# Patient Record
Sex: Female | Born: 1970 | Race: White | Hispanic: No | Marital: Married | Smoking: Never smoker
Health system: Southern US, Community
[De-identification: ages and names within clinical notes are randomized; demographics above are authoritative.]

## PROBLEM LIST (undated history)

## (undated) DIAGNOSIS — G4730 Sleep apnea, unspecified: Secondary | ICD-10-CM

## (undated) DIAGNOSIS — C50212 Malignant neoplasm of upper-inner quadrant of left female breast: Secondary | ICD-10-CM

## (undated) DIAGNOSIS — I10 Essential (primary) hypertension: Secondary | ICD-10-CM

## (undated) DIAGNOSIS — Z803 Family history of malignant neoplasm of breast: Secondary | ICD-10-CM

## (undated) DIAGNOSIS — Z87442 Personal history of urinary calculi: Secondary | ICD-10-CM

## (undated) DIAGNOSIS — Z923 Personal history of irradiation: Secondary | ICD-10-CM

## (undated) DIAGNOSIS — J45909 Unspecified asthma, uncomplicated: Secondary | ICD-10-CM

## (undated) HISTORY — PX: DILATION AND CURETTAGE OF UTERUS: SHX78

## (undated) HISTORY — DX: Malignant neoplasm of upper-inner quadrant of left female breast: C50.212

## (undated) HISTORY — PX: TONSILLECTOMY: SUR1361

## (undated) HISTORY — DX: Family history of malignant neoplasm of breast: Z80.3

---

## 1988-09-22 DIAGNOSIS — C801 Malignant (primary) neoplasm, unspecified: Secondary | ICD-10-CM

## 1988-09-22 HISTORY — DX: Malignant (primary) neoplasm, unspecified: C80.1

## 2003-09-23 HISTORY — PX: BREAST EXCISIONAL BIOPSY: SUR124

## 2006-09-22 HISTORY — PX: BACK SURGERY: SHX140

## 2021-07-30 ENCOUNTER — Other Ambulatory Visit: Payer: Self-pay

## 2021-07-30 ENCOUNTER — Ambulatory Visit
Admission: RE | Admit: 2021-07-30 | Discharge: 2021-07-30 | Disposition: A | Discharge status: Home / Self Care | Payer: Commercial Managed Care - PPO | Source: Ambulatory Visit | Attending: Internal Medicine | Admitting: Internal Medicine

## 2021-07-30 ENCOUNTER — Other Ambulatory Visit: Payer: Self-pay | Admitting: Internal Medicine

## 2021-07-30 DIAGNOSIS — Z1231 Encounter for screening mammogram for malignant neoplasm of breast: Secondary | ICD-10-CM

## 2021-08-02 ENCOUNTER — Other Ambulatory Visit: Payer: Self-pay | Admitting: Internal Medicine

## 2021-08-02 DIAGNOSIS — R928 Other abnormal and inconclusive findings on diagnostic imaging of breast: Secondary | ICD-10-CM

## 2021-08-30 ENCOUNTER — Other Ambulatory Visit: Payer: Self-pay | Admitting: Internal Medicine

## 2021-08-30 ENCOUNTER — Ambulatory Visit
Admission: RE | Admit: 2021-08-30 | Discharge: 2021-08-30 | Disposition: A | Discharge status: Home / Self Care | Payer: Commercial Managed Care - PPO | Source: Ambulatory Visit | Attending: Internal Medicine | Admitting: Internal Medicine

## 2021-08-30 DIAGNOSIS — R928 Other abnormal and inconclusive findings on diagnostic imaging of breast: Secondary | ICD-10-CM

## 2021-08-30 DIAGNOSIS — N6320 Unspecified lump in the left breast, unspecified quadrant: Secondary | ICD-10-CM

## 2021-09-10 ENCOUNTER — Ambulatory Visit
Admission: RE | Admit: 2021-09-10 | Discharge: 2021-09-10 | Disposition: A | Discharge status: Home / Self Care | Payer: Commercial Managed Care - PPO | Source: Ambulatory Visit | Attending: Internal Medicine | Admitting: Internal Medicine

## 2021-09-10 DIAGNOSIS — N6320 Unspecified lump in the left breast, unspecified quadrant: Secondary | ICD-10-CM

## 2021-09-24 ENCOUNTER — Other Ambulatory Visit: Payer: Self-pay | Admitting: General Surgery

## 2021-09-24 DIAGNOSIS — Z170 Estrogen receptor positive status [ER+]: Secondary | ICD-10-CM

## 2021-09-24 DIAGNOSIS — C50212 Malignant neoplasm of upper-inner quadrant of left female breast: Secondary | ICD-10-CM

## 2021-09-25 ENCOUNTER — Other Ambulatory Visit: Payer: Self-pay | Admitting: General Surgery

## 2021-09-25 ENCOUNTER — Telehealth: Payer: Self-pay | Admitting: Genetic Counselor

## 2021-09-25 DIAGNOSIS — Z170 Estrogen receptor positive status [ER+]: Secondary | ICD-10-CM

## 2021-09-25 DIAGNOSIS — C50212 Malignant neoplasm of upper-inner quadrant of left female breast: Secondary | ICD-10-CM

## 2021-09-25 NOTE — Telephone Encounter (Signed)
Scheduled appt per 1/4 referral. Pt is aware of appt date and time.

## 2021-09-26 ENCOUNTER — Ambulatory Visit (INDEPENDENT_AMBULATORY_CARE_PROVIDER_SITE_OTHER): Payer: Commercial Managed Care - PPO | Admitting: Plastic Surgery

## 2021-09-26 ENCOUNTER — Other Ambulatory Visit: Payer: Self-pay

## 2021-09-26 VITALS — BP 120/66 | HR 85 | Ht 65.0 in | Wt 287.8 lb

## 2021-09-26 DIAGNOSIS — C50912 Malignant neoplasm of unspecified site of left female breast: Secondary | ICD-10-CM

## 2021-09-26 NOTE — Progress Notes (Signed)
Referring Provider Stark Klein, MD [place],   [id number]   CC:  Chief Complaint  Patient presents with   Advice Only      Destiny Griffin is an 51 y.o. female.  HPI: Patient presents to discuss breast reconstruction.  She has a newly diagnosed left-sided breast cancer.  She has seen Dr. Barry Dienes and is still considering lumpectomy versus mastectomy.  She does have a mother who had a unilateral cancer which was treated with unilateral mastectomy and subsequently had a new primary on the contralateral side that had to be treated.  She is concerned about this outcome for herself.  She does not smoke and is not a diabetic.  She had genetic screening sent off and will likely use that to determine which cancer surgical procedure she prefers.  Allergies  Allergen Reactions   Penicillins Rash    Outpatient Encounter Medications as of 09/26/2021  Medication Sig   lisinopril-hydrochlorothiazide (ZESTORETIC) 10-12.5 MG tablet Take 1 tablet by mouth daily.   pantoprazole (PROTONIX) 40 MG tablet Take by mouth.   No facility-administered encounter medications on file as of 09/26/2021.     No past medical history on file.  Past Surgical History:  Procedure Laterality Date   BREAST EXCISIONAL BIOPSY Left 2005   removed lump    Family History  Problem Relation Age of Onset   Breast cancer Mother     Social History   Social History Narrative   Not on file     Review of Systems General: Denies fevers, chills, weight loss CV: Denies chest pain, shortness of breath, palpitations  Physical Exam Vitals with BMI 09/26/2021  Height 5\' 5"   Weight 287 lbs 13 oz  BMI 01.09  Systolic 323  Diastolic 66  Pulse 85    General:  No acute distress,  Alert and oriented, Non-Toxic, Normal speech and affect Breast: She has grade 3 ptosis.  She does have a left-sided upper outer quadrant scar from a benign cyst removal done in the past.  Sternal notch to nipple  distance is 31 cm bilaterally and base width is 13.5 cm.  Assessment/Plan I long discussion with the patient about her options.  I did bring up the possibility if she has a lumpectomy of a oncoplastic reduction.  This could either be done a week or 2 after her lumpectomy or well down the line after radiation if she chooses.  The mass is 1 cm in size and she may not have much distortion from the lumpectomy itself but she does report the desire to want to be smaller and lifted and this would certainly accomplish that.  Alternatively if she chose bilateral mastectomy we did discuss immediate placement of a tissue expander followed by expansion and replacement with a gel implant at a second stage.  This was allow her to participate in the decision making regarding the size which I think would be helpful for her.  We discussed the details of that process along with the risks of each procedure that include bleeding, infection, damage to surrounding structures need for additional procedures.  Discussed that wound healing complications could ultimately result in loss of the implant or expander.  We discussed the need for drains with expander placement.  All of her questions were answered regarding both approaches and we will wait to see what the results of her genetic analysis are and how she wants to proceed from a cancer standpoint.  Cindra Presume 09/26/2021,  4:31 PM

## 2021-09-30 ENCOUNTER — Other Ambulatory Visit: Payer: Self-pay | Admitting: *Deleted

## 2021-09-30 ENCOUNTER — Telehealth: Payer: Self-pay | Admitting: Hematology and Oncology

## 2021-09-30 DIAGNOSIS — C50912 Malignant neoplasm of unspecified site of left female breast: Secondary | ICD-10-CM

## 2021-09-30 DIAGNOSIS — Z170 Estrogen receptor positive status [ER+]: Secondary | ICD-10-CM

## 2021-09-30 NOTE — Telephone Encounter (Signed)
Scheduled appt per 1/9 referral. Called pt, no answer. Left msg for pt to call back to confirm appt date and time.

## 2021-10-01 NOTE — Progress Notes (Signed)
Surgical Instructions   Your procedure is scheduled on Wednesday, 10/09/21.  Report to [facility] Main Entrance "A" at 11:00 A.M., then check in with the Admitting office.  Call [phone] if you have problems or questions between now and the morning of surgery:   Remember: Do not eat after midnight the night before your surgery  You may drink clear liquids until 10:00 a.m. the morning of your surgery.   Clear liquids allowed are: Water, Non-Citrus Juices (without pulp), Carbonated Beverages, Clear Tea, Black Coffee Only (NO MILK, CREAM, or POWDERED CREAMER of any kind), and Gatorade   Take these medicines the morning of surgery with A SIP OF WATER:  Pantoprazole (Protonix)   If needed you may take these medications the morning of surgery: Albuterol (Ventolin) inhaler   As of today, STOP taking any Aspirin (unless otherwise instructed by your surgeon) or Aspirin-containing products; NSAIDS - Aleve, Naproxen, Ibuprofen, Motrin, Advil, Goody's, BC's, all herbal medications, fish oil, and all vitamins.   3 days leading up to your surgery  You are not required to quarantine however you are required to wear a well-fitting mask when you are out and around people not in your household.  If your mask becomes wet or soiled, replace with a new one.  Wash your hands often with soap and water for 20 seconds or clean your hands with an alcohol-based hand sanitizer that contains at least 60% alcohol.  Do not share personal items.  Notify your provider: if you are in close contact with someone who has COVID  or if you develop a fever of 100.4 or greater, sneezing, cough, sore throat, shortness of breath or body aches.           Do not wear jewelry or makeup  Do not wear lotions, powders, perfumes/colognes, or deodorant.  Do not shave 48 hours prior to surgery.  Men may shave face and neck.  Do not wear nail polish, gel polish, artificial nails, or any other type of covering on natural  nails including fingernails and toenails. If patients have artificial nails, gel coating, etc. that need to be removed by a nail salon please have this removed prior to surgery or surgery may need to be canceled/delayed if the surgeon/ anesthesia feels like the patient is unable to be adequately monitored.  Do not bring valuables to the hospital - [facility] is not responsible for any belongings or valuables.  Do NOT Smoke (Tobacco/Vaping) or drink Alcohol 24 hours prior to your procedure  If you use a CPAP at night, please bring your mask for your overnight stay.   Contacts, glasses, hearing aids, dentures or partials may not be worn into surgery, please bring cases for these belongings   For patients admitted to the hospital, discharge time will be determined by your treatment team.   Patients discharged the day of surgery will not be allowed to drive home, and someone needs to stay with them for 24 hours.  NO VISITORS WILL BE ALLOWED IN PRE-OP WHERE PATIENTS ARE PREPPED FOR SURGERY.  ONLY 1 SUPPORT PERSON MAY BE PRESENT IN THE WAITING ROOM WHILE YOU ARE IN SURGERY.  IF YOU ARE TO BE ADMITTED, ONCE YOU ARE IN YOUR ROOM YOU WILL BE ALLOWED TWO (2) VISITORS. 1 (ONE) VISITOR MAY STAY OVERNIGHT BUT MUST ARRIVE TO THE ROOM BY 8pm.  Minor children may have two parents present. Special consideration for safety and communication needs will be reviewed on a case by case basis.  Special  instructions:    Oral Hygiene is also important to reduce your risk of infection.  Remember - BRUSH YOUR TEETH THE MORNING OF SURGERY WITH YOUR REGULAR TOOTHPASTE   [facility]- Preparing For Surgery  Before surgery, you can play an important role. Because skin is not sterile, your skin needs to be as free of germs as possible. You can reduce the number of germs on your skin by washing with CHG (chlorahexidine gluconate) Soap before surgery.  CHG is an antiseptic cleaner which kills germs and bonds with the skin to  continue killing germs even after washing.     Please do not use if you have an allergy to CHG or antibacterial soaps. If your skin becomes reddened/irritated stop using the CHG.  Do not shave (including legs and underarms) for at least 48 hours prior to first CHG shower. It is OK to shave your face.  Please follow these instructions carefully.     Shower the NIGHT BEFORE SURGERY and the MORNING OF SURGERY with CHG Soap.   If you chose to wash your hair, wash your hair first as usual with your normal shampoo. After you shampoo, rinse your hair and body thoroughly to remove the shampoo.    Then [employer] and genitals (private parts) with your normal soap and rinse thoroughly to remove soap.  Next use the CHG Soap as you would any other liquid soap. You can apply CHG directly to the skin and wash gently with a clean washcloth.   Apply the CHG Soap to your body ONLY FROM THE NECK DOWN.  Do not use on open wounds or open sores. Avoid contact with your eyes, ears, mouth and genitals (private parts). Wash Face and genitals (private parts)  with your normal soap.   Wash thoroughly, paying special attention to the area where your surgery will be performed.  Thoroughly rinse your body with warm water from the neck down.  DO NOT shower/wash with your normal soap after using and rinsing off the CHG Soap.  Pat yourself dry with a CLEAN TOWEL.  Wear CLEAN PAJAMAS to bed the night before surgery  Place CLEAN SHEETS on your bed the night before your surgery  DO NOT SLEEP WITH PETS.   Day of Surgery:  Take a shower with CHG soap. Wear Clean/Comfortable clothing the morning of surgery Do not apply any deodorants/lotions.   Remember to brush your teeth WITH YOUR REGULAR TOOTHPASTE.   Please read over the fact sheets that you were given.

## 2021-10-02 ENCOUNTER — Inpatient Hospital Stay: Payer: Commercial Managed Care - PPO

## 2021-10-02 ENCOUNTER — Encounter (HOSPITAL_COMMUNITY)
Admission: RE | Admit: 2021-10-02 | Discharge: 2021-10-02 | Disposition: A | Discharge status: Home / Self Care | Payer: Commercial Managed Care - PPO | Source: Ambulatory Visit | Attending: General Surgery | Admitting: General Surgery

## 2021-10-02 ENCOUNTER — Inpatient Hospital Stay: Payer: Commercial Managed Care - PPO | Attending: Genetic Counselor | Admitting: Genetic Counselor

## 2021-10-02 ENCOUNTER — Encounter (HOSPITAL_COMMUNITY): Payer: Self-pay

## 2021-10-02 ENCOUNTER — Other Ambulatory Visit: Payer: Self-pay

## 2021-10-02 ENCOUNTER — Other Ambulatory Visit: Payer: Self-pay | Admitting: Genetic Counselor

## 2021-10-02 VITALS — BP 134/72 | HR 90 | Temp 98.3°F | Resp 17 | Ht 65.0 in | Wt 294.7 lb

## 2021-10-02 DIAGNOSIS — C50912 Malignant neoplasm of unspecified site of left female breast: Secondary | ICD-10-CM

## 2021-10-02 DIAGNOSIS — Z170 Estrogen receptor positive status [ER+]: Secondary | ICD-10-CM

## 2021-10-02 DIAGNOSIS — C50212 Malignant neoplasm of upper-inner quadrant of left female breast: Secondary | ICD-10-CM | POA: Diagnosis not present

## 2021-10-02 DIAGNOSIS — I10 Essential (primary) hypertension: Secondary | ICD-10-CM | POA: Diagnosis not present

## 2021-10-02 DIAGNOSIS — Z01818 Encounter for other preprocedural examination: Secondary | ICD-10-CM | POA: Diagnosis not present

## 2021-10-02 DIAGNOSIS — Z803 Family history of malignant neoplasm of breast: Secondary | ICD-10-CM | POA: Diagnosis not present

## 2021-10-02 HISTORY — DX: Essential (primary) hypertension: I10

## 2021-10-02 HISTORY — DX: Unspecified asthma, uncomplicated: J45.909

## 2021-10-02 HISTORY — DX: Sleep apnea, unspecified: G47.30

## 2021-10-02 HISTORY — DX: Personal history of urinary calculi: Z87.442

## 2021-10-02 LAB — BASIC METABOLIC PANEL
Anion gap: 8 (ref 5–15)
BUN: 14 mg/dL (ref 6–20)
CO2: 29 mmol/L (ref 22–32)
Calcium: 8.8 mg/dL — ABNORMAL LOW (ref 8.9–10.3)
Chloride: 100 mmol/L (ref 98–111)
Creatinine, Ser: 0.78 mg/dL (ref 0.44–1.00)
GFR, Estimated: >60 mL/min (ref >60)
Glucose, Bld: 90 mg/dL (ref 70–99)
Potassium: 3.5 mmol/L (ref 3.5–5.1)
Sodium: 137 mmol/L (ref 135–145)

## 2021-10-02 LAB — GENETIC SCREENING ORDER

## 2021-10-02 LAB — CBC
HCT: 39.5 % (ref 36.0–46.0)
Hemoglobin: 12.4 g/dL (ref 12.0–15.0)
MCH: 28.1 pg (ref 26.0–34.0)
MCHC: 31.4 g/dL (ref 30.0–36.0)
MCV: 89.6 fL (ref 80.0–100.0)
Platelets: 561 10*3/uL — ABNORMAL HIGH (ref 150–400)
RBC: 4.41 MIL/uL (ref 3.87–5.11)
RDW: 14.3 % (ref 11.5–15.5)
WBC: 12.1 10*3/uL — ABNORMAL HIGH (ref 4.0–10.5)
nRBC: 0 % (ref 0.0–0.2)

## 2021-10-02 NOTE — Progress Notes (Signed)
PCP - Dr. Glenda Chroman- Ledell Noss, [place] Cardiologist - denies  PPM/ICD - denies   Chest x-ray - 10 + years ago, no abnormalities per pt EKG - 2008, NSR per pt- records requested Stress Test - denies ECHO - denies Cardiac Cath - denies  Sleep Study - pt states about 20 years ago she was diagnosed with OSA. She went to ENT, they removed her uvula and she has had no issues with sleep apnea since CPAP - no  DM- denies  Blood Thinner Instructions: n/a Aspirin Instructions: n/a  ERAS Protcol - yes PRE-SURGERY Ensure given at PAT  COVID TEST- N/A, ambulatory surgery   Anesthesia review: yes, records requested  Patient denies shortness of breath, fever, cough and chest pain at PAT appointment   All instructions explained to the patient, with a verbal understanding of the material. Patient agrees to go over the instructions while at home for a better understanding. Patient also instructed to wear a mask in public for 3 days prior to surgery. The opportunity to ask questions was provided.

## 2021-10-03 ENCOUNTER — Encounter: Payer: Self-pay | Admitting: Genetic Counselor

## 2021-10-03 DIAGNOSIS — C50212 Malignant neoplasm of upper-inner quadrant of left female breast: Secondary | ICD-10-CM | POA: Insufficient documentation

## 2021-10-03 DIAGNOSIS — Z803 Family history of malignant neoplasm of breast: Secondary | ICD-10-CM | POA: Insufficient documentation

## 2021-10-03 NOTE — Progress Notes (Signed)
REFERRING PROVIDER: Stark Klein, MD [place],  [place] [id number]  PRIMARY PROVIDER:  Pcp, No  PRIMARY REASON FOR VISIT:  1. Family history of breast cancer   2. Malignant neoplasm of upper-inner quadrant of left breast in female, estrogen receptor positive ([place])      HISTORY OF PRESENT ILLNESS:   Ms. Novitski, a 51 y.o. female, was seen for a [facility] cancer genetics consultation at the request of Dr. Barry Dienes due to a personal and family history of breast cancer.  Ms. Falter presents to clinic today to discuss the possibility of a hereditary predisposition to cancer, genetic testing, and to further clarify her future cancer risks, as well as potential cancer risks for family members.   In January 2023, at the age of 5, Ms. Cancelliere was diagnosed with cancer of the left beast. The treatment plan includes lumpectomy and radiation.      CANCER HISTORY:  Oncology History   No history exists.     RISK FACTORS:  Menarche was at age 41-11.  First live birth at age 70.  OCP use for approximately 2 years.  Ovaries intact: yes.  Hysterectomy: no.  Menopausal status: postmenopausal.  HRT use: 0 years. Colonoscopy: yes; normal. Mammogram within the last year: yes. Number of breast biopsies: 2. Up to date with pelvic exams: yes. Any excessive radiation exposure in the past: no  Past Medical History:  Diagnosis Date   Asthma    Cancer ([place]) 1990   cervical   Family history of breast cancer    History of kidney stones    Hypertension    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[place])    Sleep apnea    hx of sleep apnea around 2000, ENT removed uvula, no sleep apnea since    Past Surgical History:  Procedure Laterality Date   BACK SURGERY  2008   lumbar fusion   BREAST EXCISIONAL BIOPSY Left 2005   removed lump   DILATION AND CURETTAGE OF UTERUS     1987 and [place]     removed as a child    Social History    Socioeconomic History   Marital status: Married    Spouse name: Not on file   Number of children: Not on file   Years of education: Not on file   Highest education level: Not on file  Occupational History   Not on file  Tobacco Use   Smoking status: Never   Smokeless tobacco: Never  Vaping Use   Vaping Use: Never used  Substance and Sexual Activity   Alcohol use: Yes    Comment: very rare (glass of wine maybe just on holidays)   Drug use: Never   Sexual activity: Yes    Birth control/protection: Surgical    Comment: husband- vasectomy  Other Topics Concern   Not on file  Social History Narrative   Not on file   Social Determinants of Health   Financial Resource Strain: Not on file  Food Insecurity: Not on file  Transportation Needs: Not on file  Physical Activity: Not on file  Stress: Not on file  Social Connections: Not on file     FAMILY HISTORY:  We obtained a detailed, 4-generation family history.  Significant diagnoses are listed below: Family History  Problem Relation Age of Onset   Breast cancer Mother 48       Bilateral   Stomach cancer Father  The patient has one son who died at age 30.  He has a son who is currently 92 and cancer free.  She has one full brother who is cancer free. She knows of two maternal half sisters and there are others as well that she is not aware of, who she believes is cancer free.  Both parents are deceased.  The patient's mother had bilateral breast cancer in her 53's.  There is no additional information about this side of the family.  The patient's father may have had stomach cancer and died.  He had siblings but there is no additional information on this side of the family.  Ms. Goughnour is unaware of previous family history of genetic testing for hereditary cancer risks. Patient's maternal ancestors are of [place] descent, and paternal ancestors are of Caucasian descent. There is no reported Ashkenazi Jewish ancestry. There is  no known consanguinity.  GENETIC COUNSELING ASSESSMENT: Ms. Karras is a 51 y.o. female with a personal and family history of breast cancer which is somewhat suggestive of a hereditary cancer syndrome and predisposition to cancer given the number of breast cancer cases in the family. We, therefore, discussed and recommended the following at today's visit.   DISCUSSION: We discussed that, in general, most cancer is not inherited in families, but instead is sporadic or familial. Sporadic cancers occur by chance and typically happen at older ages (>50 years) as this type of cancer is caused by genetic changes acquired during an individuals lifetime. Some families have more cancers than would be expected by chance; however, the ages or types of cancer are not consistent with a known genetic mutation or known genetic mutations have been ruled out. This type of familial cancer is thought to be due to a combination of multiple genetic, environmental, hormonal, and lifestyle factors. While this combination of factors likely increases the risk of cancer, the exact source of this risk is not currently identifiable or testable.  We discussed that 5 - 10% of breast cancer is hereditary, with most cases associated with BRCA mutations.  There are other genes that can be associated with hereditary breast cancer syndromes.  These include ATM, CHEK2 and PALB2.  We discussed that testing is beneficial for several reasons including knowing how to follow individuals after completing their treatment, identifying whether potential treatment options such as PARP inhibitors would be beneficial, and understand if other family members could be at risk for cancer and allow them to undergo genetic testing.   We reviewed the characteristics, features and inheritance patterns of hereditary cancer syndromes. We also discussed genetic testing, including the appropriate family members to test, the process of testing, insurance coverage and  turn-around-time for results. We discussed the implications of a negative, positive and/or variant of uncertain significant result. In order to get genetic test results in a timely manner so that Ms. Leugers can use these genetic test results for surgical decisions, we recommended Ms. Waner pursue genetic testing for the BRCA Plus. Once complete, we recommend Ms. Desa pursue reflex genetic testing to the CancerNext-Expanded+RNAinsight gene panel.   The CancerNext-Expanded gene panel offered by [facility] and includes sequencing and rearrangement analysis for the following 77 genes: AIP, ALK, APC*, ATM*, AXIN2, BAP1, BARD1, BLM, BMPR1A, BRCA1*, BRCA2*, BRIP1*, CDC73, CDH1*, CDK4, CDKN1B, CDKN2A, CHEK2*, CTNNA1, DICER1, FANCC, FH, FLCN, GALNT12, KIF1B, LZTR1, MAX, MEN1, MET, MLH1*, MSH2*, MSH3, MSH6*, MUTYH*, NBN, NF1*, NF2, NTHL1, PALB2*, PHOX2B, PMS2*, POT1, PRKAR1A, PTCH1, PTEN*, RAD51C*, RAD51D*, RB1, RECQL, RET, SDHA, SDHAF2,  SDHB, SDHC, SDHD, SMAD4, SMARCA4, SMARCB1, SMARCE1, STK11, SUFU, TMEM127, TP53*, TSC1, TSC2, VHL and XRCC2 (sequencing and deletion/duplication); EGFR, EGLN1, HOXB13, KIT, MITF, PDGFRA, POLD1, and POLE (sequencing only); EPCAM and GREM1 (deletion/duplication only). DNA and RNA analyses performed for * genes.   Based on Ms. Carstens personal and family history of cancer, she meets medical criteria for genetic testing. Despite that she meets criteria, she may still have an out of pocket cost.   PLAN: After considering the risks, benefits, and limitations, Ms. Loudin provided informed consent to pursue genetic testing and the blood sample was sent to [facility] for analysis of the CancerNext-Expanded+RNAinsight panel. Results should be available within approximately 5-10 days for the BRCA plus and a total of 2-3 weeks' time, at which point they will be disclosed by telephone to Ms. Maddux, as will any additional recommendations warranted by these results. Ms. Carpenter will receive  a summary of her genetic counseling visit and a copy of her results once available. This information will also be available in Epic.   Lastly, we encouraged Ms. Wesby to remain in contact with cancer genetics annually so that we can continuously update the family history and inform her of any changes in cancer genetics and testing that may be of benefit for this family.   Ms. Taliaferro questions were answered to her satisfaction today. Our contact information was provided should additional questions or concerns arise. Thank you for the referral and allowing [place] to share in the care of your patient.   Gerda Yin P. Florene Glen, [place], [facility] Licensed, Insurance risk surveyor [email or web address] phone: [phone]  The patient was seen for a total of 35 minutes in face-to-face genetic counseling.  The patient was seen alone.  This patient was discussed with Drs. Magrinat, Lindi Adie and/or Burr Medico who agrees with the above.    _______________________________________________________________________ For Office Staff:  Number of people involved in session: 1 Was an Intern/ student involved with case: no

## 2021-10-04 ENCOUNTER — Inpatient Hospital Stay: Payer: Commercial Managed Care - PPO | Admitting: Hematology and Oncology

## 2021-10-04 ENCOUNTER — Telehealth: Payer: Self-pay | Admitting: Plastic Surgery

## 2021-10-04 ENCOUNTER — Encounter: Payer: Self-pay | Admitting: *Deleted

## 2021-10-04 NOTE — Telephone Encounter (Signed)
LVM to follow up with patient about her decision regarding reconstruction. Advd pt to call back to notify us so we can proceed.

## 2021-10-08 ENCOUNTER — Ambulatory Visit
Admission: RE | Admit: 2021-10-08 | Discharge: 2021-10-08 | Disposition: A | Discharge status: Home / Self Care | Payer: Commercial Managed Care - PPO | Source: Ambulatory Visit | Attending: General Surgery | Admitting: General Surgery

## 2021-10-08 ENCOUNTER — Other Ambulatory Visit: Payer: Self-pay | Admitting: General Surgery

## 2021-10-08 DIAGNOSIS — Z170 Estrogen receptor positive status [ER+]: Secondary | ICD-10-CM

## 2021-10-08 DIAGNOSIS — C50212 Malignant neoplasm of upper-inner quadrant of left female breast: Secondary | ICD-10-CM

## 2021-10-08 NOTE — H&P (Signed)
REFERRING PHYSICIAN:  Vyas   PROVIDER:  Georgianne Fick, MD   Care Team: Patient Care Team: Glenda Chroman, MD as PCP - General (Internal Medicine) Georgianne Fick, MD as Consulting Provider (Surgical Oncology)    MRN: [id number] DOB: 1971/01/30 DATE OF ENCOUNTER: 09/24/2021   Subjective    Chief Complaint: Left breast cancer       History of Present Illness: Destiny Griffin is a 51 y.o. female who is seen today as an office consultation at the request of Dr. Woody Seller for evaluation of Left breast cancer   Patient presents with a new diagnosis of screening detected left breast cancer 08/2021.  She underwent screening mammography which was abnormal for a possible left breast mass.  Diagnostic imaging was performed.  This demonstrated a 1 cm mass at 11:00 on the left.  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   Of note, the patient has a mother who had breast cancer and has had bilateral mastectomies.  She does not know a lot of her family history but knows that she has 2 half-sisters, 1 of which has had breast surgery.  They are not close enough to know any details.   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     Diagnostic mammogram/us: 08/30/21 ACR Breast Density Category c: The breast tissue is heterogeneously dense, which may obscure small masses.   FINDINGS: Mammogram:   Spot compression tomosynthesis cc and full field mL tomosynthesis views of the left breast were performed. There is persistence of an irregular mass in the upper inner left breast measuring approximately 0.8 cm.   Ultrasound:   Targeted ultrasound performed in the left breast demonstrates an irregular hypoechoic mass at 11 o'clock 5 cm from the nipple measuring 1.0 x 0.8 x 0.6 cm. No internal vascularity. This corresponds to the mammographic finding.    Targeted ultrasound of the left axilla demonstrates normal lymph nodes.   IMPRESSION: Indeterminate mass in the left breast at 11 o'clock measuring 1.0 cm.   RECOMMENDATION: Ultrasound-guided core needle biopsy of the left breast mass.   I have discussed the findings and recommendations with the patient who agrees to proceed with biopsy. The patient will be scheduled for the biopsy appointment prior to leaving the office today.   BI-RADS CATEGORY  4: Suspicious.   Pathology core needle biopsy: 09/10/21 Breast, left, needle core biopsy, left, 11:00, 5cmfn - INVASIVE DUCTAL CARCINOMA Based on the biopsy, the carcinoma appears Nottingham grade 2 of 3 and measures 0.6 cm in greatest linear extent.   Receptors: The tumor cells are NEGATIVE for Her2 (1+). Estrogen Receptor: 100%, POSITIVE, STRONG STAINING INTENSITY Progesterone Receptor: 100%, POSITIVE, STRONG STAINING INTENSITY Proliferation Marker Ki67: 10%   Review of Systems: A complete review of systems was obtained from the patient.  I have reviewed this information and discussed as appropriate with the patient.  See HPI as well for other ROS.   Review of Systems  Psychiatric/Behavioral: The patient is nervous/anxious.   All other systems reviewed and are negative.       Medical History: Past Medical History      Past Medical History:  Diagnosis Date   Anemia     Anxiety     Asthma, unspecified asthma severity, unspecified whether complicated, unspecified whether persistent     History of cancer  Patient Active Problem List  Diagnosis   Malignant neoplasm of upper-inner quadrant of left breast in female, estrogen receptor positive (CMS-HCC)   Family history of breast cancer      Past Surgical History       Past Surgical History:  Procedure Laterality Date   back surgery   2008   knee surgery   2015        Allergies      Allergies  Allergen Reactions   Penicillins Rash               Current Outpatient Medications on File Prior to Visit  Medication Sig Dispense Refill   lisinopriL-hydrochlorothiazide (ZESTORETIC) 10-12.5 mg tablet Take 1 tablet by mouth once daily       pantoprazole (PROTONIX) 40 MG DR tablet Take 40 mg by mouth once daily        No current facility-administered medications on file prior to visit.      Family History       Family History  Problem Relation Age of Onset   Hyperlipidemia (Elevated cholesterol) Mother     Coronary Artery Disease (Blocked arteries around heart) Mother     Breast cancer Mother          Social History        Tobacco Use  Smoking Status Former   Types: Cigarettes  Smokeless Tobacco Never      Social History  Social History         Socioeconomic History   Marital status: Married  Tobacco Use   Smoking status: Former      Types: Cigarettes   Smokeless tobacco: Never  Substance and Sexual Activity   Alcohol use: Yes   Drug use: Never        Objective:         Vitals:    09/24/21 1352  BP: 120/76  Pulse: (!) 116  Temp: 37 C (98.6 F)  SpO2: 98%  Weight: (!) 129.2 kg (284 lb 12.8 oz)  Height: 165.1 cm (_0 )    Body mass index is 47.39 kg/m.       Gen:  No acute distress.  Well nourished and well groomed.   Neurological: Alert and oriented to person, place, and time. Coordination normal.  Head: Normocephalic and atraumatic.  Eyes: Conjunctivae are normal. Pupils are equal, round, and reactive to light. No scleral icterus.  Neck: Normal range of motion. Neck supple. No tracheal deviation or thyromegaly present.  Cardiovascular: Normal rate, regular rhythm, normal heart sounds and intact distal pulses.  Exam reveals no gallop and no friction rub.  No murmur heard. Breast: ptotic bilaterally.  Symmetric.  Left breast with angled scar on the UOQ.  Bruising superomedial to the nipple.  Slight nipple retraction that is symmetric bilaterally (and unchanged per patient). No palpable LAD.  No  palpable masses on either breast.   Respiratory: Effort normal.  No respiratory distress. No chest wall tenderness. Breath sounds normal.  No wheezes, rales or rhonchi.  GI: Soft. Bowel sounds are normal. The abdomen is soft and nontender.  There is no rebound and no guarding.  Musculoskeletal: Normal range of motion. Extremities are nontender.  Lymphadenopathy: No cervical, preauricular, postauricular or axillary adenopathy is present Skin: Skin is warm and dry. No rash noted. No diaphoresis. No erythema. No pallor. No clubbing, cyanosis, or edema.   Psychiatric: Normal mood and affect. Behavior is normal. Judgment and thought content normal.      Labs None  applicable.     Assessment and Plan:    Malignant neoplasm of upper-inner quadrant of left breast in female, estrogen receptor positive (CMS-HCC) Patient has a new diagnosis of clinical T1cN0 left breast cancer.  Patient is a candidate for breast conservation.  However, she is considering mastectomy with contralateral mastectomy for symmetry given the fact that her mother had bilateral breast cancer.   We had a long discussion regarding pros and cons of mastectomy including risk of bleeding, recovery time, and chronic pain.  We also discussed risk of dissatisfaction with scars and/or reconstruction.  I discussed that I would do a left-sided sentinel node biopsy regardless of what kind of surgery that we did with breast cancer.  I also discussed that chemotherapy decisions are not made based on the type of surgery but rather on a genetic test of the tumor itself.  I also discussed that mastectomy does not necessarily prevent the need for radiation.   We will send her for urgent genetics as well as urgent plastics referral to review her options.   We will tentatively plan to do a seed localized lumpectomy with sentinel node biopsy.     For lumpectomy  I discussed the incision type and location and that we would need radiology involved with a  seed marker and sentinel node.       The risks and benefits of the procedure were described to the patient and she wishes to proceed.     We discussed the risks bleeding, infection, damage to other structures, need for further procedures/surgeries.  We discussed the risk of seroma.  The patient was advised if the area in the breast in cancer, we may need to go back to surgery for additional tissue to obtain negative margins or for a lymph node biopsy. The patient was advised that these are the most common complications, but that others can occur as well.  They were advised against taking aspirin or other anti-inflammatory agents/blood thinners the week before surgery.       If she decides for mastectomy or bilateral mastectomies, she will likely benefit from neoadjuvant tamoxifen as it will take longer to schedule a longer operation.   Family history of breast cancer Will refer for urgent genetics.

## 2021-10-09 ENCOUNTER — Other Ambulatory Visit: Payer: Self-pay

## 2021-10-09 ENCOUNTER — Ambulatory Visit (HOSPITAL_COMMUNITY): Payer: Commercial Managed Care - PPO

## 2021-10-09 ENCOUNTER — Ambulatory Visit (HOSPITAL_COMMUNITY)
Admission: RE | Admit: 2021-10-09 | Discharge: 2021-10-09 | Disposition: A | Discharge status: Home / Self Care | Payer: Commercial Managed Care - PPO | Attending: General Surgery | Admitting: General Surgery

## 2021-10-09 ENCOUNTER — Encounter (HOSPITAL_COMMUNITY)
Admission: RE | Disposition: A | Discharge status: Home / Self Care | Payer: Self-pay | Source: Home / Self Care | Attending: General Surgery

## 2021-10-09 ENCOUNTER — Ambulatory Visit
Admission: RE | Admit: 2021-10-09 | Discharge: 2021-10-09 | Disposition: A | Discharge status: Home / Self Care | Payer: Commercial Managed Care - PPO | Source: Ambulatory Visit | Attending: General Surgery | Admitting: General Surgery

## 2021-10-09 ENCOUNTER — Encounter (HOSPITAL_COMMUNITY): Payer: Self-pay | Admitting: General Surgery

## 2021-10-09 DIAGNOSIS — Z803 Family history of malignant neoplasm of breast: Secondary | ICD-10-CM | POA: Diagnosis not present

## 2021-10-09 DIAGNOSIS — Z87891 Personal history of nicotine dependence: Secondary | ICD-10-CM | POA: Diagnosis not present

## 2021-10-09 DIAGNOSIS — Z6841 Body Mass Index (BMI) 40.0 and over, adult: Secondary | ICD-10-CM | POA: Insufficient documentation

## 2021-10-09 DIAGNOSIS — Z170 Estrogen receptor positive status [ER+]: Secondary | ICD-10-CM | POA: Diagnosis not present

## 2021-10-09 DIAGNOSIS — J45909 Unspecified asthma, uncomplicated: Secondary | ICD-10-CM | POA: Diagnosis not present

## 2021-10-09 DIAGNOSIS — G4730 Sleep apnea, unspecified: Secondary | ICD-10-CM | POA: Insufficient documentation

## 2021-10-09 DIAGNOSIS — C50212 Malignant neoplasm of upper-inner quadrant of left female breast: Secondary | ICD-10-CM | POA: Insufficient documentation

## 2021-10-09 DIAGNOSIS — N6082 Other benign mammary dysplasias of left breast: Secondary | ICD-10-CM | POA: Insufficient documentation

## 2021-10-09 DIAGNOSIS — N6489 Other specified disorders of breast: Secondary | ICD-10-CM | POA: Insufficient documentation

## 2021-10-09 DIAGNOSIS — I10 Essential (primary) hypertension: Secondary | ICD-10-CM | POA: Insufficient documentation

## 2021-10-09 HISTORY — PX: BREAST LUMPECTOMY WITH RADIOACTIVE SEED AND SENTINEL LYMPH NODE BIOPSY: SHX6550

## 2021-10-09 HISTORY — PX: BREAST LUMPECTOMY: SHX2

## 2021-10-09 LAB — POCT PREGNANCY, URINE: Preg Test, Ur: NEGATIVE

## 2021-10-09 SURGERY — BREAST LUMPECTOMY WITH RADIOACTIVE SEED AND SENTINEL LYMPH NODE BIOPSY
Anesthesia: General | Site: Breast | Laterality: Left

## 2021-10-09 MED ORDER — DEXMEDETOMIDINE (PRECEDEX) IN NS 20 MCG/5ML (4 MCG/ML) IV SYRINGE
PREFILLED_SYRINGE | INTRAVENOUS | Status: DC | PRN
  Administered 2021-10-09: 8 ug via INTRAVENOUS

## 2021-10-09 MED ORDER — ACETAMINOPHEN 500 MG PO TABS: 1000.0000 mg | ORAL_TABLET | ORAL | Status: AC

## 2021-10-09 MED ORDER — CEFAZOLIN IN SODIUM CHLORIDE 3-0.9 GM/100ML-% IV SOLN
3.0000 g | INTRAVENOUS | Status: AC
  Administered 2021-10-09: 3 g via INTRAVENOUS

## 2021-10-09 MED ORDER — PHENYLEPHRINE HCL (PRESSORS) 10 MG/ML IV SOLN
INTRAVENOUS | Status: DC | PRN
Start: 2021-10-09 — End: 2021-10-09
  Administered 2021-10-09 (×2): 40 ug via INTRAVENOUS
  Administered 2021-10-09: 80 ug via INTRAVENOUS
  Administered 2021-10-09: 40 ug via INTRAVENOUS

## 2021-10-09 MED ORDER — CHLORHEXIDINE GLUCONATE CLOTH 2 % EX PADS: 6.0000 | MEDICATED_PAD | Freq: Once | CUTANEOUS | Status: DC

## 2021-10-09 MED ORDER — ALBUTEROL SULFATE HFA 108 (90 BASE) MCG/ACT IN AERS
INHALATION_SPRAY | RESPIRATORY_TRACT | Status: DC | PRN
  Administered 2021-10-09: 2 via RESPIRATORY_TRACT

## 2021-10-09 MED ORDER — FENTANYL CITRATE (PF) 100 MCG/2ML IJ SOLN
INTRAMUSCULAR | Status: DC | PRN
  Administered 2021-10-09 (×3): 50 ug via INTRAVENOUS
  Administered 2021-10-09: 100 ug via INTRAVENOUS

## 2021-10-09 MED ORDER — MIDAZOLAM HCL 2 MG/2ML IJ SOLN
INTRAMUSCULAR | Status: AC
  Filled 2021-10-09: qty 2

## 2021-10-09 MED ORDER — CHLORHEXIDINE GLUCONATE 0.12 % MT SOLN: 15.0000 mL | Freq: Once | OROMUCOSAL | Status: AC

## 2021-10-09 MED ORDER — FENTANYL CITRATE (PF) 100 MCG/2ML IJ SOLN: 100.0000 ug | Freq: Once | INTRAMUSCULAR | Status: AC

## 2021-10-09 MED ORDER — ONDANSETRON HCL 4 MG/2ML IJ SOLN
INTRAMUSCULAR | Status: DC | PRN
Start: 2021-10-09 — End: 2021-10-09
  Administered 2021-10-09: 4 mg via INTRAVENOUS

## 2021-10-09 MED ORDER — BUPIVACAINE-EPINEPHRINE (PF) 0.25% -1:200000 IJ SOLN
INTRAMUSCULAR | Status: AC
  Filled 2021-10-09: qty 30

## 2021-10-09 MED ORDER — PROPOFOL 10 MG/ML IV BOLUS
INTRAVENOUS | Status: DC | PRN
  Administered 2021-10-09: 200 mg via INTRAVENOUS

## 2021-10-09 MED ORDER — GABAPENTIN 300 MG PO CAPS: 300.0000 mg | ORAL_CAPSULE | ORAL | Status: AC

## 2021-10-09 MED ORDER — ALBUTEROL SULFATE HFA 108 (90 BASE) MCG/ACT IN AERS
INHALATION_SPRAY | RESPIRATORY_TRACT | Status: AC
  Filled 2021-10-09: qty 6.7

## 2021-10-09 MED ORDER — AMISULPRIDE (ANTIEMETIC) 5 MG/2ML IV SOLN
10.0000 mg | Freq: Once | INTRAVENOUS | Status: AC | PRN
  Administered 2021-10-09: 10 mg via INTRAVENOUS

## 2021-10-09 MED ORDER — DEXAMETHASONE SODIUM PHOSPHATE 10 MG/ML IJ SOLN
INTRAMUSCULAR | Status: AC
  Filled 2021-10-09: qty 1

## 2021-10-09 MED ORDER — GABAPENTIN 300 MG PO CAPS
ORAL_CAPSULE | ORAL | Status: AC
  Administered 2021-10-09: 300 mg via ORAL
  Filled 2021-10-09: qty 1

## 2021-10-09 MED ORDER — PROPOFOL 10 MG/ML IV BOLUS
INTRAVENOUS | Status: AC
  Filled 2021-10-09: qty 20

## 2021-10-09 MED ORDER — MAGTRACE LYMPHATIC TRACER
INTRAMUSCULAR | Status: DC | PRN
Start: 2021-10-09 — End: 2021-10-09
  Administered 2021-10-09: 2 mL via INTRAMUSCULAR

## 2021-10-09 MED ORDER — LACTATED RINGERS IV SOLN: INTRAVENOUS | Status: DC

## 2021-10-09 MED ORDER — CEFAZOLIN IN SODIUM CHLORIDE 3-0.9 GM/100ML-% IV SOLN
INTRAVENOUS | Status: AC
  Filled 2021-10-09: qty 100

## 2021-10-09 MED ORDER — HYDROMORPHONE HCL 1 MG/ML IJ SOLN
0.2500 mg | INTRAMUSCULAR | Status: DC | PRN
  Administered 2021-10-09 (×2): 0.5 mg via INTRAVENOUS

## 2021-10-09 MED ORDER — ACETAMINOPHEN 500 MG PO TABS
ORAL_TABLET | ORAL | Status: AC
  Administered 2021-10-09: 1000 mg via ORAL
  Filled 2021-10-09: qty 2

## 2021-10-09 MED ORDER — ROPIVACAINE HCL 5 MG/ML IJ SOLN
INTRAMUSCULAR | Status: DC | PRN
Start: 2021-10-09 — End: 2021-10-09
  Administered 2021-10-09: 30 mL via PERINEURAL

## 2021-10-09 MED ORDER — OXYCODONE HCL 5 MG PO TABS: 5.0000 mg | ORAL_TABLET | Freq: Once | ORAL | Status: DC | PRN

## 2021-10-09 MED ORDER — PROMETHAZINE HCL 25 MG/ML IJ SOLN: 6.2500 mg | INTRAMUSCULAR | Status: DC | PRN

## 2021-10-09 MED ORDER — 0.9 % SODIUM CHLORIDE (POUR BTL) OPTIME
TOPICAL | Status: DC | PRN
  Administered 2021-10-09: 1000 mL

## 2021-10-09 MED ORDER — LIDOCAINE HCL (PF) 1 % IJ SOLN
INTRAMUSCULAR | Status: AC
  Filled 2021-10-09: qty 30

## 2021-10-09 MED ORDER — ONDANSETRON HCL 4 MG/2ML IJ SOLN
INTRAMUSCULAR | Status: AC
  Filled 2021-10-09: qty 2

## 2021-10-09 MED ORDER — FENTANYL CITRATE (PF) 100 MCG/2ML IJ SOLN
INTRAMUSCULAR | Status: AC
  Administered 2021-10-09: 100 ug via INTRAVENOUS
  Filled 2021-10-09: qty 2

## 2021-10-09 MED ORDER — OXYCODONE HCL 5 MG PO TABS: 5.0000 mg | ORAL_TABLET | Freq: Four times a day (QID) | ORAL | 0 refills | Status: DC | PRN

## 2021-10-09 MED ORDER — MEPERIDINE HCL 25 MG/ML IJ SOLN: 6.2500 mg | INTRAMUSCULAR | Status: DC | PRN

## 2021-10-09 MED ORDER — CHLORHEXIDINE GLUCONATE 0.12 % MT SOLN
OROMUCOSAL | Status: AC
  Administered 2021-10-09: 15 mL via OROMUCOSAL
  Filled 2021-10-09: qty 15

## 2021-10-09 MED ORDER — MIDAZOLAM HCL 2 MG/2ML IJ SOLN
INTRAMUSCULAR | Status: DC | PRN
  Administered 2021-10-09: 1 mg via INTRAVENOUS

## 2021-10-09 MED ORDER — FENTANYL CITRATE (PF) 250 MCG/5ML IJ SOLN
INTRAMUSCULAR | Status: AC
  Filled 2021-10-09: qty 5

## 2021-10-09 MED ORDER — MIDAZOLAM HCL 2 MG/2ML IJ SOLN
INTRAMUSCULAR | Status: AC
  Administered 2021-10-09: 2 mg via INTRAVENOUS
  Filled 2021-10-09: qty 2

## 2021-10-09 MED ORDER — OXYCODONE HCL 5 MG/5ML PO SOLN: 5.0000 mg | Freq: Once | ORAL | Status: DC | PRN

## 2021-10-09 MED ORDER — HYDROMORPHONE HCL 1 MG/ML IJ SOLN
INTRAMUSCULAR | Status: AC
  Filled 2021-10-09: qty 1

## 2021-10-09 MED ORDER — ORAL CARE MOUTH RINSE: 15.0000 mL | Freq: Once | OROMUCOSAL | Status: AC

## 2021-10-09 MED ORDER — AMISULPRIDE (ANTIEMETIC) 5 MG/2ML IV SOLN
INTRAVENOUS | Status: AC
  Filled 2021-10-09: qty 4

## 2021-10-09 MED ORDER — MIDAZOLAM HCL 2 MG/2ML IJ SOLN: 2.0000 mg | Freq: Once | INTRAMUSCULAR | Status: AC

## 2021-10-09 MED ORDER — LIDOCAINE 2% (20 MG/ML) 5 ML SYRINGE
INTRAMUSCULAR | Status: AC
  Filled 2021-10-09: qty 5

## 2021-10-09 MED ORDER — LIDOCAINE 2% (20 MG/ML) 5 ML SYRINGE
INTRAMUSCULAR | Status: DC | PRN
  Administered 2021-10-09: 40 mg via INTRAVENOUS

## 2021-10-09 MED ORDER — LIDOCAINE HCL 1 % IJ SOLN
INTRAMUSCULAR | Status: DC | PRN
  Administered 2021-10-09: 20 mL

## 2021-10-09 SURGICAL SUPPLY — 50 items
BAG COUNTER SPONGE SURGICOUNT (BAG) ×2 IMPLANT
BINDER BREAST 3XL (GAUZE/BANDAGES/DRESSINGS) ×1 IMPLANT
BINDER BREAST LRG (GAUZE/BANDAGES/DRESSINGS) IMPLANT
BINDER BREAST XLRG (GAUZE/BANDAGES/DRESSINGS) IMPLANT
BNDG COHESIVE 4X5 TAN STRL (GAUZE/BANDAGES/DRESSINGS) ×2 IMPLANT
CANISTER SUCT 3000ML PPV (MISCELLANEOUS) ×2 IMPLANT
CHLORAPREP W/TINT 26 (MISCELLANEOUS) ×2 IMPLANT
CLIP VESOCCLUDE LG 6/CT (CLIP) ×2 IMPLANT
CLIP VESOCCLUDE MED 24/CT (CLIP) ×2 IMPLANT
CNTNR URN SCR LID CUP LEK RST (MISCELLANEOUS) IMPLANT
CONT SPEC 4OZ STRL OR WHT (MISCELLANEOUS) ×3
COVER PROBE W GEL 5X96 (DRAPES) ×3 IMPLANT
COVER SURGICAL LIGHT HANDLE (MISCELLANEOUS) ×2 IMPLANT
DERMABOND ADVANCED (GAUZE/BANDAGES/DRESSINGS) ×2
DERMABOND ADVANCED .7 DNX12 (GAUZE/BANDAGES/DRESSINGS) ×1 IMPLANT
DEVICE DUBIN SPECIMEN MAMMOGRA (MISCELLANEOUS) ×1 IMPLANT
DRAPE CHEST BREAST 15X10 FENES (DRAPES) ×2 IMPLANT
DRAPE SURG 17X23 STRL (DRAPES) IMPLANT
DRSG PAD ABDOMINAL 8X10 ST (GAUZE/BANDAGES/DRESSINGS) ×2 IMPLANT
ELECT BLADE 4.0 EZ CLEAN MEGAD (MISCELLANEOUS) ×2
ELECT COATED BLADE 2.86 ST (ELECTRODE) ×2 IMPLANT
ELECT REM PT RETURN 9FT ADLT (ELECTROSURGICAL) ×2
ELECTRODE BLDE 4.0 EZ CLN MEGD (MISCELLANEOUS) IMPLANT
ELECTRODE REM PT RTRN 9FT ADLT (ELECTROSURGICAL) ×1 IMPLANT
GAUZE SPONGE 4X4 12PLY STRL (GAUZE/BANDAGES/DRESSINGS) ×1 IMPLANT
GLOVE SURG ENC MOIS LTX SZ6 (GLOVE) ×2 IMPLANT
GLOVE SURG UNDER LTX SZ6.5 (GLOVE) ×2 IMPLANT
GOWN STRL REUS W/ TWL LRG LVL3 (GOWN DISPOSABLE) ×1 IMPLANT
GOWN STRL REUS W/TWL 2XL LVL3 (GOWN DISPOSABLE) ×2 IMPLANT
GOWN STRL REUS W/TWL LRG LVL3 (GOWN DISPOSABLE) ×2
KIT BASIN OR (CUSTOM PROCEDURE TRAY) ×2 IMPLANT
KIT MARKER MARGIN INK (KITS) ×2 IMPLANT
LIGHT WAVEGUIDE WIDE FLAT (MISCELLANEOUS) ×1 IMPLANT
NDL 18GX1X1/2 (RX/OR ONLY) (NEEDLE) IMPLANT
NDL FILTER BLUNT 18X1 1/2 (NEEDLE) IMPLANT
NDL HYPO 25GX1X1/2 BEV (NEEDLE) ×1 IMPLANT
NEEDLE 18GX1X1/2 (RX/OR ONLY) (NEEDLE) IMPLANT
NEEDLE FILTER BLUNT 18X 1/2SAF (NEEDLE)
NEEDLE FILTER BLUNT 18X1 1/2 (NEEDLE) IMPLANT
NEEDLE HYPO 25GX1X1/2 BEV (NEEDLE) ×2 IMPLANT
NS IRRIG 1000ML POUR BTL (IV SOLUTION) ×2 IMPLANT
PACK GENERAL/GYN (CUSTOM PROCEDURE TRAY) ×2 IMPLANT
PACK UNIVERSAL I (CUSTOM PROCEDURE TRAY) ×2 IMPLANT
STOCKINETTE IMPERVIOUS 9X36 MD (GAUZE/BANDAGES/DRESSINGS) ×2 IMPLANT
STRIP CLOSURE SKIN 1/2X4 (GAUZE/BANDAGES/DRESSINGS) ×1 IMPLANT
SUT MNCRL AB 4-0 PS2 18 (SUTURE) ×2 IMPLANT
SUT VIC AB 3-0 SH 8-18 (SUTURE) ×2 IMPLANT
SYR CONTROL 10ML LL (SYRINGE) ×2 IMPLANT
TOWEL GREEN STERILE (TOWEL DISPOSABLE) ×2 IMPLANT
TOWEL GREEN STERILE FF (TOWEL DISPOSABLE) ×2 IMPLANT

## 2021-10-09 NOTE — Anesthesia Procedure Notes (Signed)
Anesthesia Regional Block: Pectoralis block   Pre-Anesthetic Checklist: , timeout performed,  Correct Patient, Correct Site, Correct Laterality,  Correct Procedure, Correct Position, site marked,  Risks and benefits discussed,  Surgical consent,  Pre-op evaluation,  At surgeon's request and post-op pain management  Laterality: Left  Prep: chloraprep       Needles:  Injection technique: Single-shot  Needle Type: Stimiplex     Needle Length: 9cm  Needle Gauge: 21     Additional Needles:   Procedures:,,,, ultrasound used (permanent image in chart),,    Narrative:  Start time: 10/09/2021 12:10 PM End time: 10/09/2021 12:15 PM Injection made incrementally with aspirations every 5 mL.  Performed by: Personally  Anesthesiologist: Lynda Rainwater, MD

## 2021-10-09 NOTE — Transfer of Care (Signed)
Immediate Anesthesia Transfer of Care Note  Patient: Destiny Griffin  Procedure(s) Performed: LEFT BREAST LUMPECTOMY WITH RADIOACTIVE SEED AND SENTINEL LYMPH NODE BIOPSY (Left: Breast)  Patient Location: PACU  Anesthesia Type:General  Level of Consciousness: awake, alert  and oriented  Airway & Oxygen Therapy: Patient Spontanous Breathing and Patient connected to nasal cannula oxygen  Post-op Assessment: Report given to RN and Post -op Vital signs reviewed and stable  Post vital signs: Reviewed and stable  Last Vitals:  Vitals Value Taken Time  BP    Temp    Pulse 92 10/09/21 1416  Resp    SpO2 97 % 10/09/21 1416  Vitals shown include unvalidated device data.  Last Pain:  Vitals:   10/09/21 1141  TempSrc:   PainSc: 0-No pain         Complications: No notable events documented.

## 2021-10-09 NOTE — Interval H&P Note (Signed)
History and Physical Interval Note:  10/09/2021 11:42 AM  Destiny Griffin  has presented today for surgery, with the diagnosis of LEFT BREAST CANCER.  The various methods of treatment have been discussed with the patient and family. After consideration of risks, benefits and other options for treatment, the patient has consented to  Procedure(s): LEFT BREAST LUMPECTOMY WITH RADIOACTIVE SEED AND SENTINEL LYMPH NODE BIOPSY (Left) as a surgical intervention.  The patient's history has been reviewed, patient examined, no change in status, stable for surgery.  I have reviewed the patient's chart and labs.  Questions were answered to the patient's satisfaction.     Stark Klein

## 2021-10-09 NOTE — Op Note (Signed)
Left Breast Radioactive seed localized lumpectomy and sentinel lymph node biopsy  Indications: This patient presents with history of left breast cancer, upper inner quadrant, cT1cN0, grade 2 invasive ductal carcinoma, +/+/-  Pre-operative Diagnosis: left breast cancer  Post-operative Diagnosis: Same  Surgeon: Stark Klein   Assistant:  Pryor Curia, RNFA  Anesthesia: General endotracheal anesthesia  ASA Class: 3  Procedure Details  The patient was seen in the Holding Room. The risks, benefits, complications, treatment options, and expected outcomes were discussed with the patient. The possibilities of bleeding, infection, the need for additional procedures, failure to diagnose a condition, and creating a complication requiring transfusion or operation were discussed with the patient. The patient concurred with the proposed plan, giving informed consent.  The site of surgery properly noted/marked. The patient was taken to Operating Room # 8, identified, and the procedure verified as Left Breast Seed localized Lumpectomy with sentinel lymph node biopsy. A Time Out was held and the above information confirmed.  MagTrace was injected into the subareolar location.    The left arm, breast, and chest were prepped and draped in standard fashion. The lumpectomy was performed by creating a superomedial circumarealar incision near the previously placed radioactive seed.  Dissection was carried down to around the point of maximum signal intensity. The cautery was used to perform the dissection.  Hemostasis was achieved with cautery. The edges of the cavity were marked with large clips, with one each medial, lateral, inferior and superior, and two clips posteriorly.   The specimen was inked with the margin marker paint kit.    Specimen radiography confirmed inclusion of the mammographic lesion, the clip, and the seed.  The background signal in the breast was zero.  The wound was irrigated and closed with  3-0 vicryl in layers and 4-0 monocryl subcuticular suture.    Using a MagTrace probe, left axillary sentinel nodes were identified transcutaneously.  An oblique incision was created below the axillary hairline.  Dissection was carried through the clavipectoral fascia.  Three deep level two axillary sentinel nodes were removed.  Counts per second were 5900+, 2300+, 200.    The background count was 0 cps.  The wound was irrigated.  Hemostasis was achieved with cautery.  The axillary incision was closed with a 3-0 vicryl deep dermal interrupted sutures and a 4-0 monocryl subcuticular closure.    Sterile dressings were applied. At the end of the operation, all sponge, instrument, and needle counts were correct.  Findings: grossly clear surgical margins and no adenopathy, anterior margin is skin, posterior margin is pectoralis.  Estimated Blood Loss:  min         Specimens: Left breast tissue with seed and three axillary sentinel lymph nodes.             Complications:  None; patient tolerated the procedure well.         Disposition: PACU - hemodynamically stable.         Condition: stable

## 2021-10-09 NOTE — Anesthesia Preprocedure Evaluation (Signed)
Anesthesia Evaluation  Patient identified by MRN, date of birth, ID band Patient awake    Reviewed: Allergy & Precautions, NPO status , Patient's Chart, lab work & pertinent test results  Airway Mallampati: II  TM Distance: >3 FB Neck ROM: Full    Dental no notable dental hx.    Pulmonary asthma , sleep apnea ,    Pulmonary exam normal breath sounds clear to auscultation       Cardiovascular hypertension, Pt. on medications negative cardio ROS Normal cardiovascular exam Rhythm:Regular Rate:Normal     Neuro/Psych negative neurological ROS  negative psych ROS   GI/Hepatic negative GI ROS, Neg liver ROS,   Endo/Other  Morbid obesity  Renal/GU negative Renal ROS  negative genitourinary   Musculoskeletal negative musculoskeletal ROS (+)   Abdominal (+) + obese,   Peds negative pediatric ROS (+)  Hematology negative hematology ROS (+)   Anesthesia Other Findings   Reproductive/Obstetrics negative OB ROS                             Anesthesia Physical Anesthesia Plan  ASA: 3  Anesthesia Plan: General   Post-op Pain Management: Regional block   Induction: Intravenous  PONV Risk Score and Plan: 3 and Ondansetron, Dexamethasone, Midazolam and Treatment may vary due to age or medical condition  Airway Management Planned: LMA  Additional Equipment:   Intra-op Plan:   Post-operative Plan: Extubation in OR  Informed Consent: I have reviewed the patients History and Physical, chart, labs and discussed the procedure including the risks, benefits and alternatives for the proposed anesthesia with the patient or authorized representative who has indicated his/her understanding and acceptance.     Dental advisory given  Plan Discussed with: CRNA  Anesthesia Plan Comments:         Anesthesia Quick Evaluation

## 2021-10-09 NOTE — Anesthesia Procedure Notes (Signed)
Procedure Name: LMA Insertion Date/Time: 10/09/2021 12:28 PM Performed by: Justin Mend, RN Pre-anesthesia Checklist: Patient identified, Emergency Drugs available, Suction available and Patient being monitored Patient Re-evaluated:Patient Re-evaluated prior to induction Oxygen Delivery Method: Circle System Utilized Preoxygenation: Pre-oxygenation with 100% oxygen Induction Type: IV induction Ventilation: Mask ventilation without difficulty LMA: LMA inserted LMA Size: 4.0 Number of attempts: 1 Placement Confirmation: positive ETCO2 Tube secured with: Tape Dental Injury: Teeth and Oropharynx as per pre-operative assessment

## 2021-10-09 NOTE — Discharge Instructions (Addendum)
[facility] Office Phone Number [phone]  BREAST BIOPSY/ PARTIAL MASTECTOMY: POST OP INSTRUCTIONS  Always review your discharge instruction sheet given to you by the facility where your surgery was performed.  IF YOU HAVE DISABILITY OR FAMILY LEAVE FORMS, YOU MUST BRING THEM TO THE OFFICE FOR PROCESSING.  DO NOT GIVE THEM TO YOUR DOCTOR.  A prescription for pain medication may be given to you upon discharge.  Take your pain medication as prescribed, if needed.  If narcotic pain medicine is not needed, then you may take acetaminophen (Tylenol) or ibuprofen (Advil) as needed. Take your usually prescribed medications unless otherwise directed If you need a refill on your pain medication, please contact your pharmacy.  They will contact our office to request authorization.  Prescriptions will not be filled after 5pm or on week-ends. You should eat very light the first 24 hours after surgery, such as soup, crackers, pudding, etc.  Resume your normal diet the day after surgery. Most patients will experience some swelling and bruising in the breast.  Ice packs and a good support bra will help.  Swelling and bruising can take several days to resolve.  It is common to experience some constipation if taking pain medication after surgery.  Increasing fluid intake and taking a stool softener will usually help or prevent this problem from occurring.  A mild laxative (Milk of Magnesia or Miralax) should be taken according to package directions if there are no bowel movements after 48 hours. Unless discharge instructions indicate otherwise, you may remove your bandages 48 hours after surgery, and you may shower at that time.  You may have steri-strips (small skin tapes) in place directly over the incision.  These strips should be left on the skin for 7-10 days.   Any sutures or staples will be removed at the office during your follow-up visit. ACTIVITIES:  You may resume regular daily activities  (gradually increasing) beginning the next day.  Wearing a good support bra or sports bra (or the breast binder) minimizes pain and swelling.  You may have sexual intercourse when it is comfortable. You may drive when you no longer are taking prescription pain medication, you can comfortably wear a seatbelt, and you can safely maneuver your car and apply brakes. RETURN TO WORK:  __________1 week_______________ You should see your doctor in the office for a follow-up appointment approximately two weeks after your surgery.  Your doctor's nurse will typically make your follow-up appointment when she calls you with your pathology report.  Expect your pathology report 2-3 business days after your surgery.  You may call to check if you do not hear from us after three days.   WHEN TO CALL YOUR DOCTOR: Fever over 101.0 Nausea and/or vomiting. Extreme swelling or bruising. Continued bleeding from incision. Increased pain, redness, or drainage from the incision.  The clinic staff is available to answer your questions during regular business hours.  Please don't hesitate to call and ask to speak to one of the nurses for clinical concerns.  If you have a medical emergency, go to the nearest emergency room or call 911.  A surgeon from [facility] is always on call at the hospital.  For further questions, please visit [email or web address]   

## 2021-10-10 ENCOUNTER — Encounter (HOSPITAL_COMMUNITY): Payer: Self-pay | Admitting: General Surgery

## 2021-10-10 NOTE — Anesthesia Postprocedure Evaluation (Signed)
Anesthesia Post Note  Patient: Destiny Griffin  Procedure(s) Performed: LEFT BREAST LUMPECTOMY WITH RADIOACTIVE SEED AND SENTINEL LYMPH NODE BIOPSY (Left: Breast)     Patient location during evaluation: PACU Anesthesia Type: General Level of consciousness: awake and alert Pain management: pain level controlled Vital Signs Assessment: post-procedure vital signs reviewed and stable Respiratory status: spontaneous breathing, nonlabored ventilation and respiratory function stable Cardiovascular status: blood pressure returned to baseline and stable Postop Assessment: no apparent nausea or vomiting Anesthetic complications: no   No notable events documented.  Last Vitals:  Vitals:   10/09/21 1450 10/09/21 1505  BP: 132/71 117/85  Pulse: 80 82  Resp: 12 12  Temp:  36.9 C  SpO2: 97% 94%    Last Pain:  Vitals:   10/09/21 1505  TempSrc:   PainSc: 3    Pain Goal:                   Lynda Rainwater

## 2021-10-11 ENCOUNTER — Telehealth: Payer: Self-pay | Admitting: Genetic Counselor

## 2021-10-11 NOTE — Telephone Encounter (Signed)
LM on VM that results are back and to please call. 

## 2021-10-11 NOTE — Telephone Encounter (Signed)
Spoke with patient and her husband.  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 We discussed that this helps explain Sheala's PH and FH of breast cancer.  Answered their questions, and will bring the patient in for reviewing the results on 10/15/2021 at 2 PM.

## 2021-10-13 LAB — SURGICAL PATHOLOGY

## 2021-10-15 ENCOUNTER — Other Ambulatory Visit: Payer: Self-pay

## 2021-10-15 ENCOUNTER — Encounter: Payer: Self-pay | Admitting: Genetic Counselor

## 2021-10-15 ENCOUNTER — Inpatient Hospital Stay (HOSPITAL_BASED_OUTPATIENT_CLINIC_OR_DEPARTMENT_OTHER): Payer: Commercial Managed Care - PPO | Admitting: Genetic Counselor

## 2021-10-15 DIAGNOSIS — Z1379 Encounter for other screening for genetic and chromosomal anomalies: Secondary | ICD-10-CM | POA: Diagnosis not present

## 2021-10-15 NOTE — Progress Notes (Signed)
GENETIC TEST RESULTS   Patient Name: Destiny Griffin Patient Age: 51 y.o. Encounter Date: 10/15/2021  Referring Provider: Stark Klein, MD    Ms. Soltis was seen in the [facility] clinic on October 15, 2021 due to a personal and family history of cancer and concern regarding a hereditary predisposition to cancer in the family. Please refer to the prior Genetics clinic note for more information regarding Ms. Edell's medical and family histories and our assessment at the time.   FAMILY HISTORY:  We obtained a detailed, 4-generation family history.  Significant diagnoses are listed below: Family History  Problem Relation Age of Onset   Breast cancer Mother 51       Bilateral   Stomach cancer Father      The patient has one son who died at age 27.  He has a son who is currently 53 and cancer free.  She has one full brother who is cancer free. She knows of two maternal half sisters and there are others as well that she is not aware of, who she believes is cancer free.  Both parents are deceased.   The patient's mother had bilateral breast cancer in her 27's.  There is no additional information about this side of the family.   The patient's father may have had stomach cancer and died.  He had siblings but there is no additional information on this side of the family.   Ms. Hinesley is unaware of previous family history of genetic testing for hereditary cancer risks. Patient's maternal ancestors are of [place] descent, and paternal ancestors are of Caucasian descent. There is no reported Ashkenazi Jewish ancestry. There is no known consanguinity.  GENETIC TESTING:  At the time of Ms. Magouirk's visit, we recommended she pursue genetic testing of the BRCAPlus panel test. 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 There were no deleterious  mutations in ATM, BRCA1, BRCA2, CDH1, PALB2, PTEN, and TP53. .  The remainder of the CancerNext-Expanded+RNAinsight panel is pending.  This will be called out once it reports out.  DISCUSSION: CHEK2 mutations have been found to be associated with an increased risk of breast and other cancers. The estimated cancer risks vary widely and may be influenced by family history. Women with a CHEK2 deleterious mutation have approximately a 20-40% lifetime risk of breast cancer and up to a 25% risk of a second breast cancer. Men may have an increased risk for female breast cancer of about 1%. Men and women may have an increased risk of colon cancer (~5-10% lifetime risk). According to the NCCN guidelines, individuals with CHEK2 mutations should consider breast MRI's as a part of regular breast cancer screening starting at age 34-35 years.  Evidence is not sufficient to recommend a risk reducing mastectomy, and should be followed based on family history.  Mammogram screening should begin, for women, at age 20 or 19 years younger than the earliest age of onset.  Colon cancer screening should begin at age 48 and continue every 5 years or based on polyp number.    An individuals cancer risk and medical management are not determined by genetic test results alone. Overall cancer risk assessment incorporates additional factors, including personal medical history, family history, and any available genetic information that may result in a personalized plan for cancer prevention and surveillance.   CANCER SCREENING: Below are the NCCN  Practice guidelines for women and men.  However, because the breast cancer risks for women and prostate cancer risks for men may be similar, it is appropriate to consider these high risk management recommendations.  Breast Management Options We reviewed the NCCN practice guidelines (v1.2017) for breast management for women at an increased risk of breast cancer because of CHEK2 mutations:    Breast awareness (which may include periodic, consistent breast self exam) starting at age 5.  Breast screening:  Starting at age 10-35, annual breast MRI screening. Starting at age 63, annual mammogram, or starting 10 years younger than the earliest age of onset.  Evidence of risk-reducing mastectomy is insufficient at this time.  [place] based on family history.  Colon Cancer Management:  Men and women with a deleterious CHEK2 mutation may have up to a 5-10% lifetime risk for colon cancer. The following is recommended for individuals with a CHEK2 mutation:  Personal history of colon cancer  Follow instructions provided by your physician based on your personal history.  Do not have a personal history of colon cancer  Colonoscopy every 5 years starting at age 87 or 20 years younger than the earliest age of onset in family, whichever is younger.   Prostate Cancer Management: It has been suggested that men with a CHEK2 pathogenic variant and a first-degree relative with prostate cancer have an annual prostate-specific antigen (PSA) analysis (PMID: 67619509). However, the benefits of screening for prostate cancer among men with a pathogenic variant in CHEK2 are uncertain (PMID: 32671245).  FAMILY MEMBERS: It is important that all of Ms. Deemer relatives (both men and women) know of the presence of this gene mutation.  Women need to know that they may be at increased risk for breast and colon cancers.  Men are at slightly increased risk for breast, prostate and colon cancers.  Genetic testing can sort out who in your family is at risk and who is not.  We would be happy to help meet with and coordinate genetic testing for any relative that is interested.  Ms. Sirmon grandson is at 25% risk to have inherited the mutation found in her. Ms. Bartley grandson is relatively young and this will not be of any consequence to him for several years. We do not test children because there is no risk to them  until they are adults.  It should also be kept in mind that for children, we are bound to know a great deal more about breast/colon cancer and its prevention in several years time. We recommend that Ms. Siebers grandson has genetic counseling and testing by the time that he is in his 20-30's.    Ms. Lites brother is at 50% risk to have inherited the mutation found in her. Her maternal half siblings are at 25% risk.  We recommend they have genetic testing for this same mutation, as identifying the presence of this mutation would allow them to also take advantage of risk-reducing measures.   Individuals with two copies of the CHEK2 1100delC variant (i.e., those who are homozygous) have a higher breast cancer risk compared with those with a single copy (i.e., those who are heterozygous). The lifetime risk of breast cancer in individuals homozygous for this variant is estimated to be increased four to six fold. The estimates of other cancer risks among homozygotes or compound heterozygotes involving other variants is unclear.  Our knowledge of cancer risks related to CHEK2 mutations will continue to evolve. We recommended that Ms. Owusu follow up with  the genetics clinic annually so we can provide her with the most current information about CHEK2 and cancer risk, as well as with any changes to her family history (new cancer diagnoses, genetic test results).  SUPPORT AND RESOURCES:  We provided information about one support group for hereditary cancer syndrome information and support, Facing Our Risk (www.facingourrisk.com),  which some people have found useful.  They provide opportunities to speak with other individuals from high-risk families.    Our contact number was provided. Ms. Wamble questions were answered to her satisfaction, and she knows she is welcome to call us at anytime with additional questions or concerns.   Roma Kayser, [place], [facility] Licensed, Certified Genetic  Counselor [email or web address] phone: [phone]  The patient was seen for a total of 35 minutes in face-to-face genetic counseling.

## 2021-10-17 ENCOUNTER — Encounter: Payer: Self-pay | Admitting: General Surgery

## 2021-10-17 DIAGNOSIS — Z1502 Genetic susceptibility to malignant neoplasm of ovary: Secondary | ICD-10-CM | POA: Insufficient documentation

## 2021-10-17 DIAGNOSIS — C50919 Malignant neoplasm of unspecified site of unspecified female breast: Secondary | ICD-10-CM | POA: Insufficient documentation

## 2021-10-21 ENCOUNTER — Telehealth: Payer: Self-pay | Admitting: Plastic Surgery

## 2021-10-21 NOTE — Telephone Encounter (Signed)
LVM for patient to call back regarding if she still desires reconstruction with Dr. Claudia Desanctis.

## 2021-10-22 ENCOUNTER — Telehealth: Payer: Self-pay | Admitting: Genetic Counselor

## 2021-10-22 NOTE — Telephone Encounter (Signed)
Revealed that the remainder of her genetic testing results were negative.  The only variant identified of concern was the CHEK2 variant that was found on the BRCAPlus panel.  The patient did not have any additional questions.

## 2021-11-07 ENCOUNTER — Other Ambulatory Visit (HOSPITAL_COMMUNITY): Payer: Self-pay | Admitting: Radiation Oncology

## 2021-11-07 ENCOUNTER — Other Ambulatory Visit: Payer: Self-pay | Admitting: Radiation Oncology

## 2021-11-07 DIAGNOSIS — Z170 Estrogen receptor positive status [ER+]: Secondary | ICD-10-CM

## 2021-11-07 DIAGNOSIS — C50212 Malignant neoplasm of upper-inner quadrant of left female breast: Secondary | ICD-10-CM

## 2021-11-15 ENCOUNTER — Ambulatory Visit (HOSPITAL_COMMUNITY)
Admission: RE | Admit: 2021-11-15 | Discharge: 2021-11-15 | Disposition: A | Discharge status: Home / Self Care | Payer: Commercial Managed Care - PPO | Source: Ambulatory Visit | Attending: Radiation Oncology | Admitting: Radiation Oncology

## 2021-11-15 ENCOUNTER — Other Ambulatory Visit: Payer: Self-pay

## 2021-11-15 DIAGNOSIS — Z170 Estrogen receptor positive status [ER+]: Secondary | ICD-10-CM | POA: Insufficient documentation

## 2021-11-15 DIAGNOSIS — C50212 Malignant neoplasm of upper-inner quadrant of left female breast: Secondary | ICD-10-CM | POA: Diagnosis present

## 2021-11-15 MED ORDER — GADOBUTROL 1 MMOL/ML IV SOLN
10.0000 mL | Freq: Once | INTRAVENOUS | Status: AC | PRN
  Administered 2021-11-15: 10 mL via INTRAVENOUS

## 2021-11-18 ENCOUNTER — Other Ambulatory Visit: Payer: Self-pay | Admitting: Radiation Oncology

## 2021-11-18 DIAGNOSIS — C50212 Malignant neoplasm of upper-inner quadrant of left female breast: Secondary | ICD-10-CM

## 2021-11-19 ENCOUNTER — Other Ambulatory Visit (HOSPITAL_COMMUNITY): Payer: Self-pay | Admitting: Radiation Oncology

## 2021-11-19 DIAGNOSIS — C50212 Malignant neoplasm of upper-inner quadrant of left female breast: Secondary | ICD-10-CM

## 2021-11-19 DIAGNOSIS — Z170 Estrogen receptor positive status [ER+]: Secondary | ICD-10-CM

## 2021-11-20 ENCOUNTER — Other Ambulatory Visit: Payer: Self-pay | Admitting: Radiation Oncology

## 2021-11-20 ENCOUNTER — Ambulatory Visit (HOSPITAL_COMMUNITY): Payer: Commercial Managed Care - PPO

## 2021-11-20 ENCOUNTER — Encounter (HOSPITAL_COMMUNITY): Payer: Self-pay

## 2021-11-20 DIAGNOSIS — C50212 Malignant neoplasm of upper-inner quadrant of left female breast: Secondary | ICD-10-CM

## 2021-11-22 ENCOUNTER — Encounter (HOSPITAL_COMMUNITY): Payer: Self-pay

## 2021-11-27 ENCOUNTER — Encounter (HOSPITAL_COMMUNITY): Payer: Self-pay

## 2021-11-28 ENCOUNTER — Other Ambulatory Visit: Payer: Self-pay

## 2021-11-28 ENCOUNTER — Ambulatory Visit
Admission: RE | Admit: 2021-11-28 | Discharge: 2021-11-28 | Disposition: A | Discharge status: Home / Self Care | Payer: Commercial Managed Care - PPO | Source: Ambulatory Visit | Attending: Radiation Oncology | Admitting: Radiation Oncology

## 2021-11-28 DIAGNOSIS — Z170 Estrogen receptor positive status [ER+]: Secondary | ICD-10-CM

## 2021-11-28 MED ORDER — GADOBUTROL 1 MMOL/ML IV SOLN
10.0000 mL | Freq: Once | INTRAVENOUS | Status: AC | PRN
  Administered 2021-11-28: 10 mL via INTRAVENOUS

## 2022-02-25 NOTE — Therapy (Incomplete)
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[place])    Sleep apnea    hx of sleep apnea around 2000, ENT removed uvula, no sleep apnea since   Past Surgical History:  Procedure Laterality Date   BACK SURGERY  2008   lumbar fusion   BREAST EXCISIONAL BIOPSY Left 2005   removed lump   BREAST LUMPECTOMY WITH RADIOACTIVE SEED AND SENTINEL LYMPH NODE BIOPSY Left 10/09/2021   Procedure: LEFT BREAST LUMPECTOMY WITH RADIOACTIVE SEED AND SENTINEL LYMPH NODE BIOPSY;  Surgeon: Stark Klein, MD;  Location: [facility];  Service: General;  Laterality: Left;   [place] and [place]     removed as a child   Patient Active Problem List   Diagnosis Date Noted   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  ONSET DATE: 10/09/21  Rationale for Evaluation and Treatment Rehabilitation  SUBJECTIVE                                                                                                                                                                                           SUBJECTIVE STATEMENT:  PERTINENT HISTORY:  Patient presented with left breast cancer 08/2021. Diagnostic imaging was performed.  This  demonstrated a 1 cm mass at 11:00 on the left.  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 Patient underwent left radioactive seed lumpectomy and sentinel lymph node biopsy on 10/09/2021. She had negative margins with a 1.1 cm tumor and 3 negative lymph nodes. She saw genetics and found out that she has a CHEK2 mutation.    PAIN:  Are you having pain? {yes/no:20286} NPRS scale: ***/10 Pain location: *** Pain orientation: {Pain Orientation:25161}  PAIN TYPE: {type:313116} Pain description: {PAIN DESCRIPTION:21022940}  Aggravating  factors: *** Relieving factors: ***  PRECAUTIONS: {Therapy precautions:24002}  WEIGHT BEARING RESTRICTIONS {Yes ***/No:24003}  FALLS:  Has patient fallen in last 6 months? {fallsyesno:27318}  LIVING ENVIRONMENT: Lives with: {[facility] lives with:25569::"lives with their family"} Lives in: {Lives in:25570} Stairs: {yes/no:20286}; {Stairs:24000} Has following equipment at home: {Assistive devices:23999}  OCCUPATION: ***  LEISURE: ***  HAND DOMINANCE : {RIGHT/LEFT:21944}   PRIOR LEVEL OF FUNCTION: {PLOF:24004}  PATIENT GOALS ***   OBJECTIVE  COGNITION:  Overall cognitive status: {cognition:24006}   PALPATION: ***  OBSERVATIONS / OTHER ASSESSMENTS: ***  POSTURE: ***  UPPER EXTREMITY AROM/PROM:  A/PROM RIGHT   eval   Shoulder extension   Shoulder flexion   Shoulder abduction   Shoulder internal rotation   Shoulder external rotation     (Blank rows = not tested)  A/PROM LEFT   eval  Shoulder extension   Shoulder flexion   Shoulder abduction   Shoulder internal rotation   Shoulder external rotation     (Blank rows = not tested)    UPPER EXTREMITY STRENGTH: ***  LYMPHEDEMA ASSESSMENTS:   SURGERY TYPE/DATE: 10/09/21- L lumpectomy and SLNB  NUMBER OF LYMPH NODES REMOVED: 0/3  CHEMOTHERAPY: ***  RADIATION:completed  HORMONE  TREATMENT: ***  INFECTIONS: ***  LYMPHEDEMA ASSESSMENTS:   LANDMARK RIGHT  eval  10 cm proximal to olecranon process   Olecranon process   10 cm proximal to ulnar styloid process   Just proximal to ulnar styloid process   Across hand at thumb web space   At base of 2nd digit   (Blank rows = not tested)  LANDMARK LEFT  eval  10 cm proximal to olecranon process   Olecranon process   10 cm proximal to ulnar styloid process   Just proximal to ulnar styloid process   Across hand at thumb web space   At base of 2nd digit   (Blank rows = not tested)   FUNCTIONAL TESTS:  {Functional tests:24029}  GAIT: Distance walked: *** Assistive device utilized: {Assistive devices:23999} Level of assistance: {Levels of assistance:24026} Comments: ***    QUICK DASH SURVEY: ***   TODAY'S TREATMENT  ***  PATIENT EDUCATION:  Education details: *** Person educated: {Person educated:25204} Education method: {Education Method:25205} Education comprehension: {Education Comprehension:25206}   HOME EXERCISE PROGRAM: ***  ASSESSMENT:  CLINICAL IMPRESSION: Patient is a *** y.o. *** who was seen today for physical therapy evaluation and treatment for ***.    OBJECTIVE IMPAIRMENTS {opptimpairments:25111}.   ACTIVITY LIMITATIONS {activitylimitations:27494}  PARTICIPATION LIMITATIONS: {participationrestrictions:25113}  PERSONAL FACTORS {Personal factors:25162} are also affecting patient's functional outcome.   REHAB POTENTIAL: {rehabpotential:25112}  CLINICAL DECISION MAKING: {clinical decision making:25114}  EVALUATION COMPLEXITY: {Evaluation complexity:25115}  GOALS: Goals reviewed with patient? {yes/no:20286}  SHORT TERM GOALS: Target date: {follow up:25551}    *** Baseline: Goal status: {GOALSTATUS:25110}  2.  *** Baseline:  Goal status: {GOALSTATUS:25110}  3.  *** Baseline:  Goal status: {GOALSTATUS:25110}  4.  *** Baseline:  Goal status:  {GOALSTATUS:25110}  5.  *** Baseline:  Goal status: {GOALSTATUS:25110}  6.  *** Baseline:  Goal status: {GOALSTATUS:25110}  LONG TERM GOALS: Target date: {follow up:25551}    *** Baseline:  Goal status: {GOALSTATUS:25110}  2.  *** Baseline:  Goal status: {GOALSTATUS:25110}  3.  *** Baseline:  Goal status: {GOALSTATUS:25110}  4.  *** Baseline:  Goal status: {GOALSTATUS:25110}  5.  *** Baseline:  Goal status: {GOALSTATUS:25110}  6.  *** Baseline:  Goal status: {GOALSTATUS:25110}  PLAN: PT FREQUENCY: {rehab frequency:25116}  PT DURATION: {rehab duration:25117}  PLANNED INTERVENTIONS: {  rehab planned interventions:25118::"Therapeutic exercises","Therapeutic activity","Neuromuscular re-education","Balance training","Gait training","Patient/Family education","Joint mobilization"}  PLAN FOR NEXT SESSION: ***   Allyson Sabal Blue, PT 02/25/2022, 8:29 AM

## 2022-02-26 ENCOUNTER — Ambulatory Visit: Payer: Commercial Managed Care - PPO | Admitting: Physical Therapy

## 2022-03-04 ENCOUNTER — Ambulatory Visit: Payer: Commercial Managed Care - PPO | Attending: General Surgery | Admitting: Physical Therapy

## 2022-03-04 DIAGNOSIS — M6281 Muscle weakness (generalized): Secondary | ICD-10-CM | POA: Insufficient documentation

## 2022-03-04 DIAGNOSIS — Z483 Aftercare following surgery for neoplasm: Secondary | ICD-10-CM | POA: Diagnosis present

## 2022-03-04 NOTE — Therapy (Signed)
OUTPATIENT PT/OT UPPER EXTREMITY LYMPHEDEMA EVALUATION  Patient Name: Destiny Griffin MRN: [id number] DOB:Jan 18, 1971, 51 y.o., female Today's Date: 03/04/2022   PT End of Session - 03/04/22 1225     Visit Number 1    Number of Visits 9    Date for PT Re-Evaluation 04/03/22    PT Start Time 0800    PT Stop Time 0850    PT Time Calculation (min) 50 min    Activity Tolerance Patient tolerated treatment well    Behavior During Therapy [facility] for tasks assessed/performed             Past Medical History:  Diagnosis Date   Asthma    Cancer ([place]) 1990   cervical   Family history of breast cancer    History of kidney stones    Hypertension    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[place])    Sleep apnea    hx of sleep apnea around 2000, ENT removed uvula, no sleep apnea since   Past Surgical History:  Procedure Laterality Date   BACK SURGERY  2008   lumbar fusion   BREAST EXCISIONAL BIOPSY Left 2005   removed lump   BREAST LUMPECTOMY WITH RADIOACTIVE SEED AND SENTINEL LYMPH NODE BIOPSY Left 10/09/2021   Procedure: LEFT BREAST LUMPECTOMY WITH RADIOACTIVE SEED AND SENTINEL LYMPH NODE BIOPSY;  Surgeon: Stark Klein, MD;  Location: [facility];  Service: General;  Laterality: Left;   [place] and [place]     removed as a child   Patient Active Problem List   Diagnosis Date Noted   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[place]) 10/17/2021   Genetic testing 10/15/2021   Family history of breast cancer 10/03/2021   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[place]) 10/03/2021    PCP:  Glenda Chroman, MD   REFERRING PROVIDER: Stark Klein, MD   REFERRING DIAG: C50.212 (ICD-10-CM) - Malignant neoplasm of upper-inner quadrant of left female breast Z17.0 (ICD-10-CM) - Estrogen receptor positive status (ER+)   THERAPY DIAG:  Lymphedema   ONSET DATE: 08/2021   Rationale for Evaluation and Treatment  Rehabilitation  SUBJECTIVE                                                                                                                                                                                           SUBJECTIVE STATEMENT:Pt states she is getting better.  She is still having some fullness in her breast. She is not able to wear her normal bras due to tenderness at the incision site and  she is mostly wearing sports bras .   PERTINENT HISTORY:   left breast cancer diagnosed 08/2021 as grade 2 ER/PR +, HER2-, Ki67 10%,lumpectomy with sentinel node biopsy 0/3 nodes 10/09/2021 .  She had chronic seroma that did nothave to be drained She had 37 treatments of radiation. She had some desquamation after radiation just finished end of May 2023   PAIN:  Are you having pain? Yes NPRS scale: 9/10 at times  Pain location: left breast shoots from the side toward nipple Pain orientation: Left  PAIN TYPE: shooting  Pain description:  shooting    Aggravating factors: carrying a heavy purse or using her arm too much  Relieving factors: pt wants to hold the breast   PRECAUTIONS: at risk for lymphedema   WEIGHT BEARING RESTRICTIONS No  FALLS:  Has patient fallen in last 6 months? 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  She has lipdema - like tissue in both upper arms.   OBSERVATIONS / OTHER ASSESSMENTS: pt has darkening from radiation in left breast and axilla with well healed incsions    SENSATION:  Light touch: Appears intact  POSTURE: Pt with forward head, round shoulders, increased lumbar lordosis at site of old back surgery scar   UPPER EXTREMITY AROM/PROM:  A/PROM RIGHT   eval   Shoulder extension 55  Shoulder flexion 160  Shoulder abduction 170  Shoulder internal rotation 90  Shoulder external rotation 35    (Blank rows = not tested)  A/PROM LEFT   eval  Shoulder extension 60  Shoulder flexion 150  Shoulder abduction 165  Shoulder internal rotation 35  Shoulder external rotation     (Blank rows = not tested)   CERVICAL AROM:  WNL except for some vertigo which she thinks is from a "plugged up" inner ear   UPPER EXTREMITY STRENGTH: about 4/5 with effort with isometric testing . Pt has grimacing with effort       LYMPHEDEMA ASSESSMENTS:   SURGERY TYPE/DATE: left breast lumpectomy 10/14/2021  NUMBER OF LYMPH NODES REMOVED: 3  CHEMOTHERAPY: no  RADIATION:yes  HORMONE TREATMENT: yes  INFECTIONS: someta  LYMPHEDEMA ASSESSMENTS:   LANDMARK RIGHT eval  10 cm proximal to olecranon process 51  Olecranon process 29.5  10 cm proximal to ulnar styloid process 27  Just proximal to ulnar styloid process 18  Across hand at thumb web space 19  At base of 2nd digit 6.2  (Blank rows = not tested)  LANDMARK LEFT  eval  10 cm proximal to olecranon process 50  Olecranon process 29  10 cm proximal to ulnar styloid process 26.5  Just proximal to ulnar styloid process 18  Across hand at thumb web space 19  At base of 2nd digit 6.1  (Blank rows = not tested)    FUNCTIONAL TESTS:  30 seconds chair stand test: 12 reps from low mat position  with 10/10 RPE.  Pt noted burning in legs and has observable dysnpnea  ASSESSMENT:  CLINICAL IMPRESSION: Patient is a 51 y.o. female  who was seen today for physical therapy evaluation and treatment for lymphedema of left breast after surgery and radiation for left breast cancer.  Pt reports she is  much better than when she saw Dr. Byerly.  She did not appear to have lymphedema in her left breast despite skin discoloration from radiation completed 3 weeks ago.  She does have increased lipedema like obesity pattern in both upper arms. She reports generalized weakness and joint pain with 15 pound weight gain since starting the letrozole and effexor.  She is interested in learning an exercise program at home to increase strength, decrease joint pain so that she can increase functional mobility and decrease lymphedema risk. Pt agrees to coming to PT 2 times a week for 4 weeks to learn that program and what else she can do to decrease her lymphedema risk.   OBJECTIVE IMPAIRMENTS decreased activity tolerance, decreased endurance, decreased mobility, decreased strength, impaired perceived functional ability, impaired UE functional use, postural dysfunction, obesity, and pain.   ACTIVITY LIMITATIONS carrying, lifting   PARTICIPATION LIMITATIONS: cleaning  PERSONAL FACTORS 3+ comorbidities: breast and back surgery, radiation, obesity   are also affecting patient's functional outcome.   REHAB POTENTIAL: Good  CLINICAL DECISION MAKING: Evolving/moderate complexity  EVALUATION COMPLEXITY: Moderate  GOALS: Goals reviewed with patient? Yes    LONG TERM GOALS: Target date: 04/01/2022    Pt will be independent in a home exercise program to decrease lymphedema risk and improve fitness  Baseline: no knowledge  Goal status: INITIAL  2.  Pt will improve 30 second sit to stand to 15 with 7 RPE Baseline: 12 with RPE of 10/10 Goal status: INITIAL  3.  Pt will verbalized lymphedema risk reduction practices  Baseline: no knowledge Goal status: INITIAL   PLAN: PT FREQUENCY: 2x/week  PT DURATION: 4 weeks  PLANNED INTERVENTIONS: Therapeutic exercises, Therapeutic activity, Neuromuscular re-education, Balance training, Gait training, Patient/Family education, and Manual lymph drainage  PLAN FOR  NEXT SESSION: teach lymphedema risk reduction, teach Strength ABC and encourage walking program. Monitor for left breast and left UE lymphedema   Teresa K. Brown, PT  Brown, Teresa Krall, PT 03/04/2022, 12:25 PM  

## 2022-03-10 ENCOUNTER — Ambulatory Visit: Payer: Commercial Managed Care - PPO

## 2022-03-12 ENCOUNTER — Ambulatory Visit: Payer: Commercial Managed Care - PPO

## 2022-03-21 ENCOUNTER — Encounter: Payer: Commercial Managed Care - PPO | Admitting: Rehabilitation

## 2022-04-01 ENCOUNTER — Encounter: Payer: Commercial Managed Care - PPO | Admitting: Rehabilitation

## 2022-06-27 LAB — COLOGUARD: COLOGUARD: POSITIVE — AB

## 2022-07-09 ENCOUNTER — Other Ambulatory Visit: Payer: Self-pay | Admitting: General Surgery

## 2022-07-09 DIAGNOSIS — Z9889 Other specified postprocedural states: Secondary | ICD-10-CM

## 2022-07-30 ENCOUNTER — Ambulatory Visit
Admission: RE | Admit: 2022-07-30 | Discharge: 2022-07-30 | Disposition: A | Discharge status: Home / Self Care | Payer: No Typology Code available for payment source | Source: Ambulatory Visit | Attending: General Surgery | Admitting: General Surgery

## 2022-07-30 DIAGNOSIS — Z9889 Other specified postprocedural states: Secondary | ICD-10-CM

## 2022-10-14 ENCOUNTER — Other Ambulatory Visit: Payer: Self-pay | Admitting: General Surgery

## 2022-10-14 DIAGNOSIS — Z803 Family history of malignant neoplasm of breast: Secondary | ICD-10-CM

## 2022-10-14 DIAGNOSIS — C50212 Malignant neoplasm of upper-inner quadrant of left female breast: Secondary | ICD-10-CM

## 2022-10-14 DIAGNOSIS — C50919 Malignant neoplasm of unspecified site of unspecified female breast: Secondary | ICD-10-CM

## 2022-11-10 ENCOUNTER — Ambulatory Visit
Admission: RE | Admit: 2022-11-10 | Discharge: 2022-11-10 | Disposition: A | Discharge status: Home / Self Care | Payer: No Typology Code available for payment source | Source: Ambulatory Visit | Attending: General Surgery | Admitting: General Surgery

## 2022-11-10 DIAGNOSIS — Z803 Family history of malignant neoplasm of breast: Secondary | ICD-10-CM

## 2022-11-10 DIAGNOSIS — C50212 Malignant neoplasm of upper-inner quadrant of left female breast: Secondary | ICD-10-CM

## 2022-11-10 DIAGNOSIS — C50919 Malignant neoplasm of unspecified site of unspecified female breast: Secondary | ICD-10-CM

## 2022-11-10 MED ORDER — GADOPICLENOL 0.5 MMOL/ML IV SOLN
10.0000 mL | Freq: Once | INTRAVENOUS | Status: AC | PRN
  Administered 2022-11-10: 10 mL via INTRAVENOUS

## 2023-06-17 ENCOUNTER — Other Ambulatory Visit: Payer: Self-pay | Admitting: General Surgery

## 2023-06-17 DIAGNOSIS — Z9889 Other specified postprocedural states: Secondary | ICD-10-CM

## 2023-08-03 ENCOUNTER — Ambulatory Visit
Admission: RE | Admit: 2023-08-03 | Discharge: 2023-08-03 | Disposition: A | Discharge status: Home / Self Care | Payer: Commercial Managed Care - PPO | Source: Ambulatory Visit | Attending: General Surgery | Admitting: General Surgery

## 2023-08-03 DIAGNOSIS — Z9889 Other specified postprocedural states: Secondary | ICD-10-CM

## 2023-08-03 HISTORY — DX: Personal history of irradiation: Z92.3

## 2023-12-11 IMAGING — MR MR BREAST BILAT WO/W CM
8 of 13 series · 32 of 48 positions shown · IV contrast (10 ml gadavist)
Comparison: Previous exams.

CLINICAL DATA: 50-year-old female with recent ultrasound-guided
core biopsy of a 1 cm mass in the left breast at the 1 o'clock
position demonstrating grade 2 invasive ductal carcinoma, post
lumpectomy 10/09/2021.

EXAM:
BILATERAL BREAST MRI WITH AND WITHOUT CONTRAST
TECHNIQUE: Multiplanar, multisequence MR images of both breasts were obtained
prior to and following the intravenous administration of 10 ml of
Gadavist

[Series 2: t2_tirm_tra ipat (a-p) · axial · 3.0mm · 0.72mm/px · 1 of 55 slices shown]
[im 1/55]
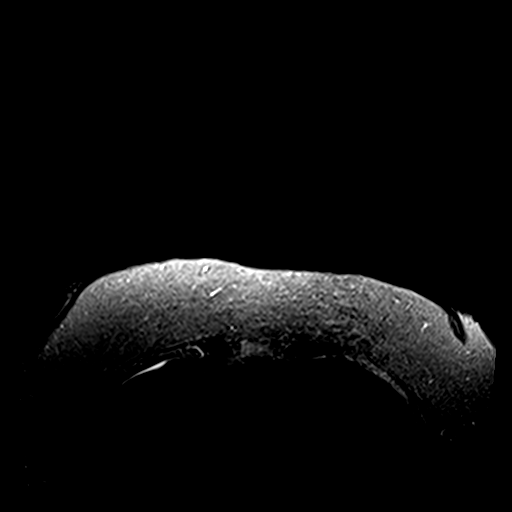

[Series 3: fl3d pre-cm no · axial · non-contrast · 1.2mm · 0.96mm/px · z∈[-57,+114]mm · 5 of 144 slices shown]
[im 1/144]
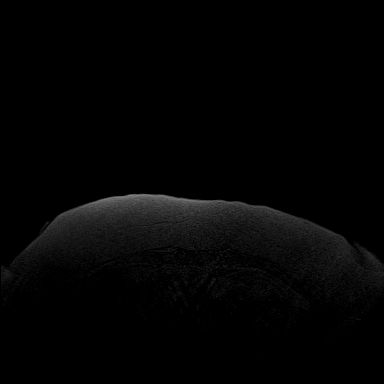
[im 36/144]
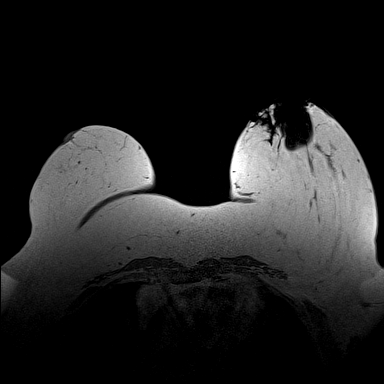
[im 72/144]
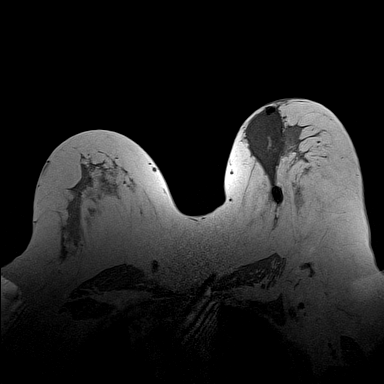
[im 108/144]
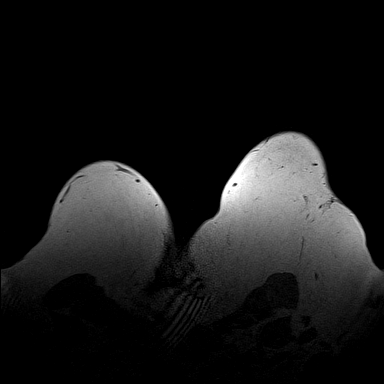
[im 144/144]
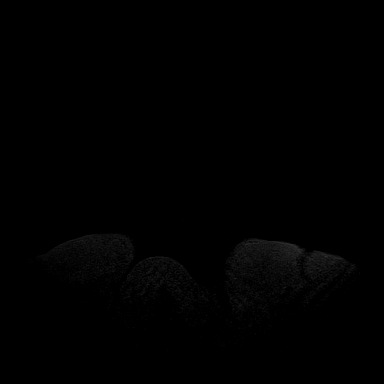

[Series 4: fl3d pre-cm · axial · non-contrast · 1.2mm · 0.96mm/px · z∈[-57,+114]mm · 5 of 144 slices shown]
[im 1/144]
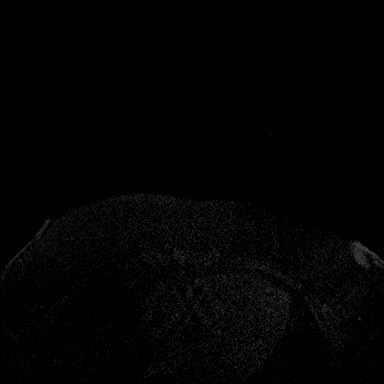
[im 36/144]
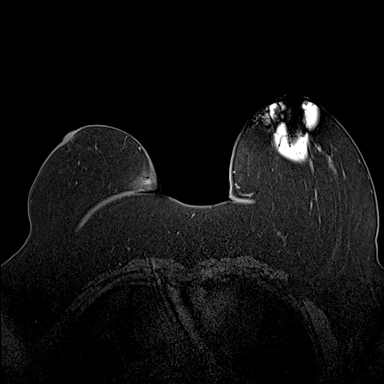
[im 72/144]
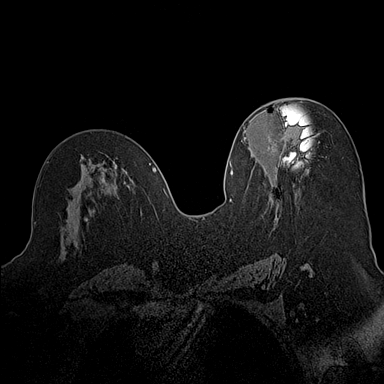
[im 108/144]
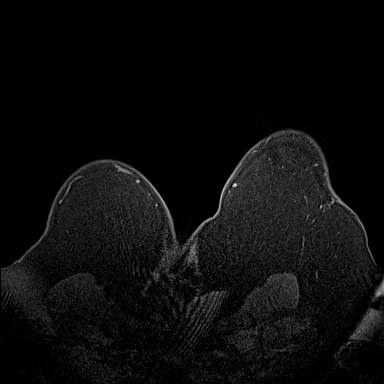
[im 144/144]
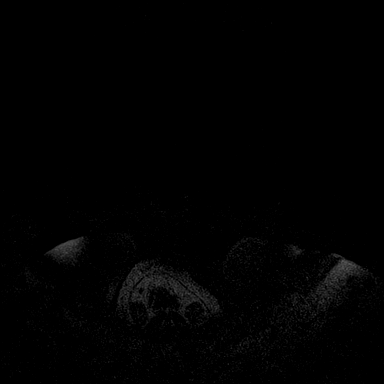

[Series 5: fl3d post-cm 20 · axial · 1.2mm · 0.96mm/px · z∈[-57,+114]mm · 5 of 144 slices shown (1 of 3)]
[im 1/144]
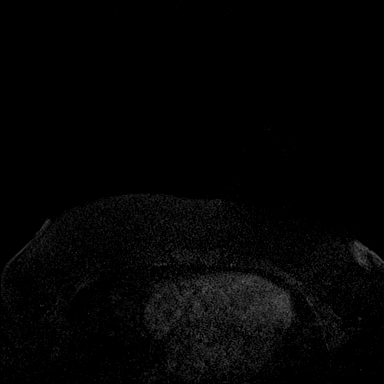
[im 36/144]
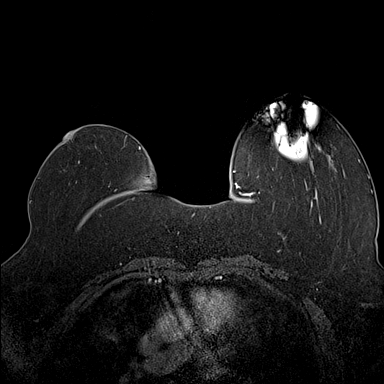
[im 72/144]
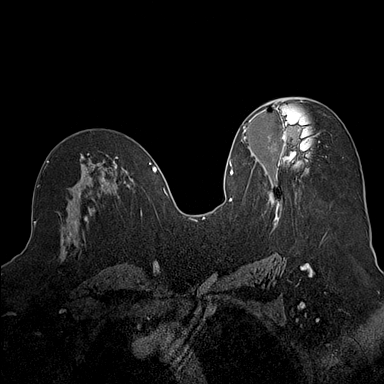
[im 108/144]
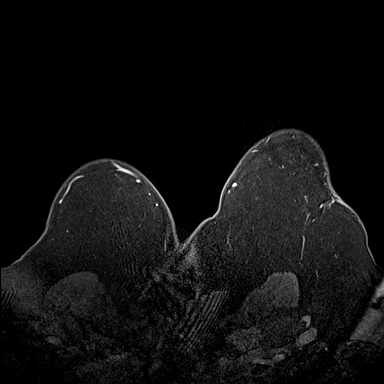
[im 144/144]
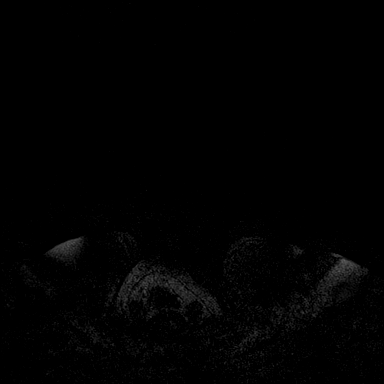

[Series 6: fl3d post-cm 20 · axial · 1.2mm · 0.96mm/px · z∈[-57,+114]mm · 5 of 144 slices shown (2 of 3)]
[im 1/144]
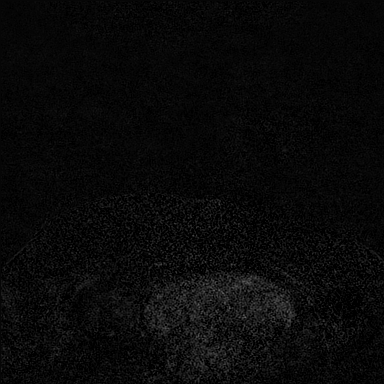
[im 36/144]
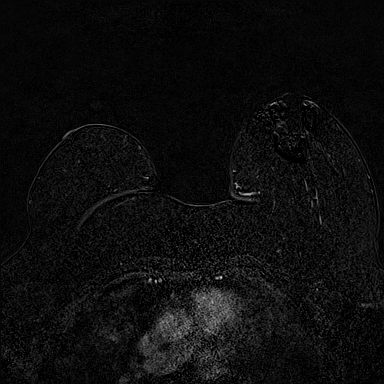
[im 72/144]
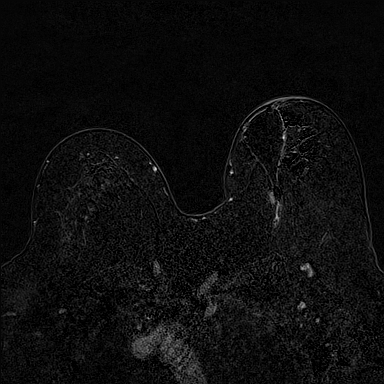
[im 108/144]
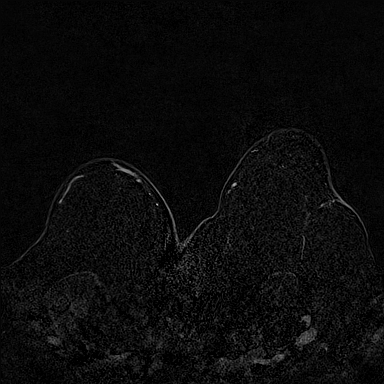
[im 144/144]
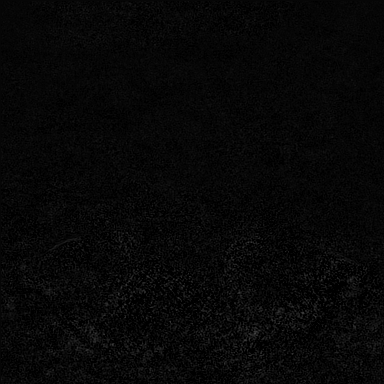

[Series 7: fl3d post-cm 20 · axial · 172.8mm · 0.96mm/px · 1 of 1 slices shown (3 of 3)]
[im 1/1]
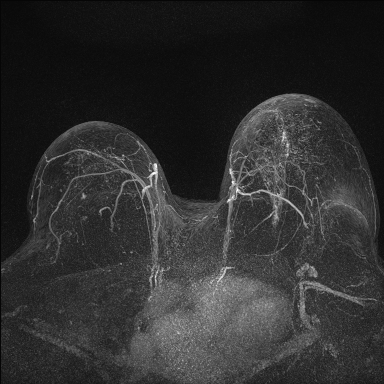

[Series 8: fl3d post-cm 3 · axial · 1.2mm · 0.96mm/px · z∈[-57,+114]mm · 5 of 144 slices shown (1 of 2)]
[im 1/144]
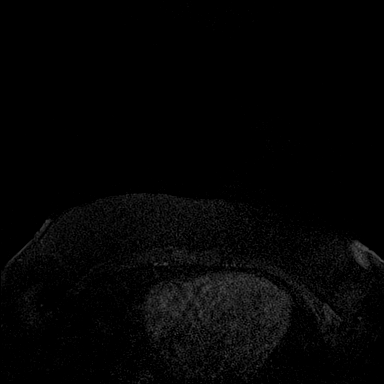
[im 36/144]
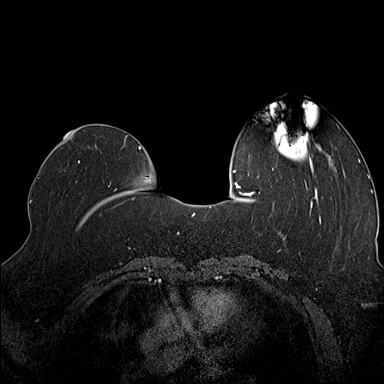
[im 72/144]
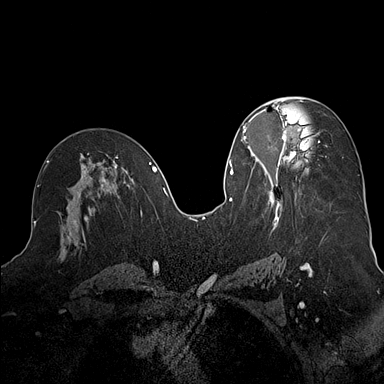
[im 108/144]
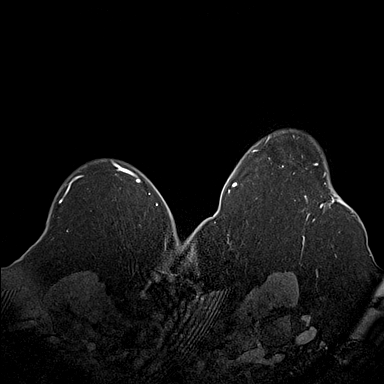
[im 144/144]
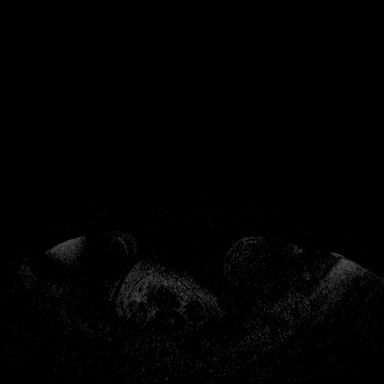

[Series 9: fl3d post-cm 3 · axial · 1.2mm · 0.96mm/px · z∈[-57,+80]mm · 5 of 144 slices shown (2 of 2)]
[im 1/144]
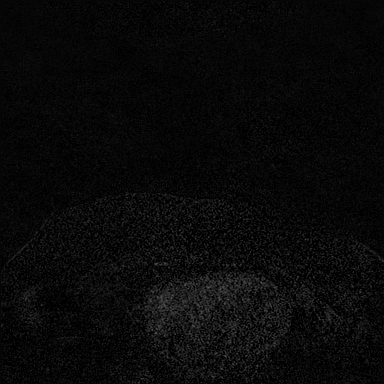
[im 29/144]
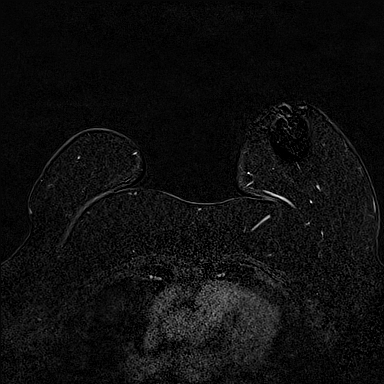
[im 58/144]
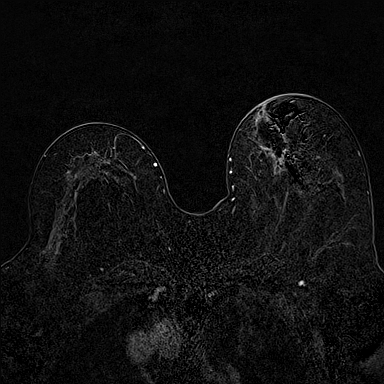
[im 86/144]
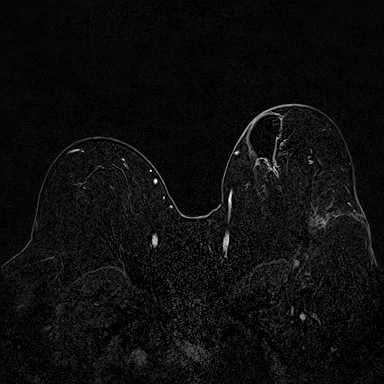
[im 115/144]
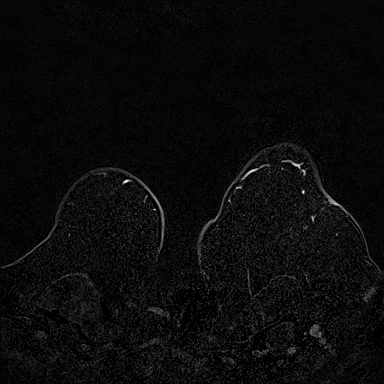

[32 of 48 positions shown; findings below may reference images not displayed]

Three-dimensional MR images were rendered by post-processing of the
original MR data on an independent workstation. The
three-dimensional MR images were interpreted, and findings are
reported in the following complete MRI report for this study. Three
dimensional images were evaluated at the independent interpreting
workstation using the DynaCAD thin client.
FINDINGS: Breast composition: c.  Heterogeneous fibroglandular tissue.

Background parenchymal enhancement: Moderate.

Right breast: No suspicious enhancing masses or any other abnormal
areas of enhancement identified in the right breast.

Left breast: Postoperative changes are present throughout the
central to inner left breast with a rim enhancing postoperative
collection measuring 7.3 cm AP and 3.4 cm transverse. There is
extensive blooming/susceptibility artifact within the lower central
anterior left breast with no obvious areas of suspicious enhancement
at the lumpectomy site. Areas of non mass enhancement surrounding
the surgical site as well as upper outer left breast/axilla are most
consistent with postsurgical change.

Lymph nodes: No abnormal appearing lymph nodes.

Ancillary findings:  None.
IMPRESSION: 1. Lumpectomy changes involving the central to inner left breast
with a 7.3 cm rim enhancing postoperative collection and surrounding
non mass enhancement. No definite additional areas of abnormal
enhancement seen in the left breast to suggest residual or
additional sites of disease.

2.  No MRI evidence of malignancy in the right breast.

RECOMMENDATION:
1. Recommend annual diagnostic mammography, due July 2022.

2. The American Cancer Society recommends annual MRI and mammography
in patients with an estimated lifetime risk of developing breast
cancer greater than 20 - 25%, or who are known or suspected to be
positive for the breast cancer gene.

BI-RADS CATEGORY  2: Benign.

## 2024-02-01 ENCOUNTER — Other Ambulatory Visit: Payer: Self-pay | Admitting: General Surgery

## 2024-02-01 DIAGNOSIS — C50212 Malignant neoplasm of upper-inner quadrant of left female breast: Secondary | ICD-10-CM

## 2024-02-01 DIAGNOSIS — Z803 Family history of malignant neoplasm of breast: Secondary | ICD-10-CM

## 2024-02-02 ENCOUNTER — Telehealth: Payer: Self-pay | Admitting: *Deleted

## 2024-02-02 NOTE — Telephone Encounter (Signed)
 Spoke with the patient regarding the referral to GYN oncology. Patient scheduled as new patient with Dr Gaylin Ke on 5/28 at 11 am. Patient given an arrival time of 10:30 am. Explained to the patient the the doctor will perform a pelvic exam at this visit. Patient given the policy that only one visitor allowed and that visitor must be over 16 yrs are allowed in the Cancer Center. Patient given the address/phone number for the clinic and that the center offers free valet service. Patient aware that masks required.

## 2024-02-03 ENCOUNTER — Encounter: Payer: Self-pay | Admitting: General Surgery

## 2024-02-10 ENCOUNTER — Encounter: Payer: Self-pay | Admitting: Obstetrics & Gynecology

## 2024-02-12 ENCOUNTER — Ambulatory Visit
Admission: RE | Admit: 2024-02-12 | Discharge: 2024-02-12 | Disposition: A | Discharge status: Home / Self Care | Source: Ambulatory Visit | Attending: General Surgery | Admitting: General Surgery

## 2024-02-12 ENCOUNTER — Ambulatory Visit: Payer: Self-pay | Admitting: General Surgery

## 2024-02-12 DIAGNOSIS — Z803 Family history of malignant neoplasm of breast: Secondary | ICD-10-CM

## 2024-02-12 DIAGNOSIS — C50212 Malignant neoplasm of upper-inner quadrant of left female breast: Secondary | ICD-10-CM

## 2024-02-12 MED ORDER — GADOPICLENOL 0.5 MMOL/ML IV SOLN
10.0000 mL | Freq: Once | INTRAVENOUS | Status: AC | PRN
  Administered 2024-02-12: 10 mL via INTRAVENOUS

## 2024-02-17 ENCOUNTER — Encounter: Payer: Self-pay | Admitting: Obstetrics & Gynecology

## 2024-02-17 ENCOUNTER — Inpatient Hospital Stay: Attending: Obstetrics & Gynecology | Admitting: Obstetrics & Gynecology

## 2024-02-17 VITALS — BP 140/78 | HR 94 | Temp 98.3°F | Resp 18 | Ht 65.0 in | Wt 300.6 lb

## 2024-02-17 DIAGNOSIS — Z1509 Genetic susceptibility to other malignant neoplasm: Secondary | ICD-10-CM | POA: Insufficient documentation

## 2024-02-17 DIAGNOSIS — Z148 Genetic carrier of other disease: Secondary | ICD-10-CM | POA: Insufficient documentation

## 2024-02-17 DIAGNOSIS — Z1589 Genetic susceptibility to other disease: Secondary | ICD-10-CM

## 2024-02-17 NOTE — Progress Notes (Signed)
    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  History of Present Illness Destiny Griffin is a 53 year old female who presents for evaluation of ovarian cancer risk due to a check tube mutation.  She is seen at the request of Vyas, Dhruv B, MD.   She is concerned about her risk of ovarian cancer associated with her genetic mutation. She has been reading literature and notes from her genetic counselor regarding this mutation. She seeks reassurance about her risk and wants to understand the symptoms to watch for, given the lack of a reliable screening test for ovarian cancer.  She has been having annual Pap tests conducted by her regular doctor, with the last pelvic exam performed in November.    Review of Systems  Review of Systems  Constitutional:  Negative for malaise/fatigue and weight loss.  Respiratory:  Negative for shortness of breath and wheezing.   Cardiovascular:  Negative for chest pain and leg swelling.  Gastrointestinal:  Negative for abdominal pain, blood in stool, constipation, nausea and vomiting.  Genitourinary:  Negative for dysuria, frequency, hematuria and urgency.  Musculoskeletal:  Negative for joint pain and myalgias.  Neurological:  Negative for weakness.  Psychiatric/Behavioral:  Negative for depression. The patient does not have insomnia.    Current medications, allergy, social history, past surgical history, past medical history, family history were all reviewed.    Vitals:  Blood pressure (!) 140/78, pulse 94, temperature 98.3 F (36.8 C), temperature source Tympanic, resp. rate 18, height 5\' 5"  (1.651 m), weight (!) 300 lb 9.6 oz (136.4 kg), SpO2 99%.  Physical Exam:  Deferred  Assessment/Plan:  Problem List Items Addressed This Visit   None Visit Diagnoses       CHEK2 gene mutation positive    -  Primary        - She was counseled that  pathogenic variants in CHEK2 do not appear to confer an increased risk for ovarian cancer. In the absence of a family history of ovarian cancer, risk-reducing bilateral salpingo-oophorectomy is not indicated.  - Insurance underwriter were provided  I personally spent 25 minutes face-to-face and non-face-to-face in the care of this patient, which includes all pre, intra, and post visit time on the date of service.    Abdul Hodgkin, MD

## 2024-02-17 NOTE — Patient Instructions (Signed)
 Until last week, Occupational hygienist (NCCN) guidelines reported that CHEK2 pathogenic variants (or risk-increasing genetic differences) are associated with an increased risk of colon cancer, citing a 5%-10% lifetime risk. NCCN recommended that carriers of a CHEK2 pathogenic variant begin colonoscopy screening earlier than typically recommended, at age 55, and repeat this screening every 5 years. (Note: recommendations for general population screening are to begin screening at age 72, and if by colonoscopy, to repeat every 10 years if no polyps are detected.)  On August 8th, 2024, NCCN published an update to the guidelines, Genetic/Familial High-Risk Assessment: Colorectal, Endometrial, and Gastric Version 1.2024 (April 30, 2023), and for the first time in many years, earlier and more frequent screening for colon cancer is no longer recommended for carriers of a CHEK2 pathogenic variant. The NCCN revised the estimated absolute risk for colon cancer from 5%-10% to "No increased risk." Now, unless there is a family history of colon cancer, NCCN recommends colon cancer screening per general population screening guidelines for patients who carry a CHEK2 pathogenic variant.  This is a significant change and will affect care for many patients. It is also a good reminder that, as we learn more about specific risks associated with specific genetic variants, guidance on care should and will change to reflect this new information. Providers need to stay up to date and patients should be regularly following with a genetic counselor or other knowledgeable provider to stay current with screening recommendations for their specific genetic variant as these recommendations evolve over time.     National Radio producer (NCCN)  NCCN is a non-profit alliance in the [place]  that aims to improve the quality, effectiveness, and efficiency of cancer care. Founded in 1995, it brings together a  Agricultural consultant from leading cancer centers to establish and promote evidence-based guidelines for cancer treatment. NCCN also publishes guidelines on recommendations for care and screening for patients at an increased risk for cancer due to inherited genetic variants. These guidelines help healthcare providers make informed decisions about the best practices for diagnosing, treating, and managing various types of cancer as well as guide care for patients with an increased genetic risk for cancer.  NCCN guidelines are typically updated on a yearly basis. However, they can be revised more frequently if new evidence or significant changes in clinical practice arise that necessitate an update. The NCCN continually reviews and updates its guidelines to reflect the latest research, advancements in cancer treatment, and emerging clinical data.   CHEK2  The CHEK2 gene, also known as Checkpoint Kinase 2, is a DNA damage repair gene that was first described in 1998. In 2001, questions were raised about the possible association of a 'breast-colon cancer syndrome' with germline (or inherited) variants in the CHEK2 gene. Many studies followed that considered a possible increased risk for colon cancer on the basis of inherited variants in CHEK2.      CHEK2 encodes a protein that has an important role in monitoring and repairing DNA damage by helping regregulate the cell cycle. Specifically, this protein is a serine/threonine kinase that  signals for cell cycle arrest in response to DNA damage, allowing time for DNA repair. If the damage is too severe, CHEK2 can also promote apoptosis (programmed cell death) to prevent the propagation of damaged cells.  By controlling repair processes and preventing damaged cells from dividing, CHEK2 functions as a tumor suppressor. This role is crucial for maintaining genomic stability and preventing cancer from developing.  For this reason,  inherited variants or mutations in the  CHEK2 gene that impair its function have been associated with an increased risk of cancer.  CHEK2-Associated Breast Cancer Risk  While pathogenic variants in the CHEK2 gene generally confer a lower risk of cancer than variants in high-risk genes (such as BRCA1 or BRCA2), they still significantly impact cancer risk. Some specific types of variants (truncating variants) affect the risk more than others (missense variants). Individuals who were assigned female at birth with inherited CHEK2 pathogenic variants have an increased risk for breast cancer, currently estimated to be between a 15%-40% lifetime risk.  Individuals assigned female at birth with CHEK2 pathogenic variants also have an increased risk for breast cancer as compared to the general population, although specific risk figures are not yet defined.    CHEK2-Associated Colon Cancer Risk  For many years it has been reported that CHEK2 pathogenic variants increase the risk for colorectal cancer, with the NCCN previously reporting an estimated lifetime risk of 5-10% compared to the general population risk of 4%. However, recent studies have reported a small to no-increased risk for colon cancer. One recent retrospective study of nearly 4,000 patients with pathogenic variants in CHEK2 found no increased risk for colorectal cancer. The NCCN has now revised its CHEK2 guidelines and reports no increased risk of colorectal cancer and recommends general population colorectal cancer screening in the absence of a family history of colorectal cancer.   CHEK2 and Other Cancer Risk  There is some preliminary evidence suggesting that truncating variants in the CHEK2 gene may be associated with an increased risk for prostate cancer in patients assigned female at birth. For this reason, NCCN guidelines suggest consideration of annual prostate cancer screening with prostate specific antigen (PSA) testing beginning at age 63, in the context of shared decision-making  with a healthcare provider.  Some studies have suggested that some CHEK2 variants may be associated with an increased risk of thyroid, pancreatic, kidney, and stomach cancers. However, such evidence is limited and more research is needed to make a determination regarding these associations before any recommendations for screening for these cancers can be recommended. There is insufficient evidence to suggest that CHEK2 pathogenic variants are associated with an increased risk for ovarian cancer at this time.  Current Guidelines for Cancer Screening CHEK2  There are currently two main guidelines referenced for cancer screening of patients who carry pathogenic variants in the CHEK2 gene:   NCCN Clinical Practice Guidelines in Oncology (NCCN Guidelines) Genetic/Familial High-Risk Assessment: Colorectal, Endometrial, and Gastric Version 1.2024 -- April 30, 2023 Management of individuals with germline pathogenic/likely pathogenic variants in CHEK2: A clinical practice resource of the [employer] ([facility]) These guidelines are more or less consistent with one another and are as follows:  Breast Cancer:  Individuals Assigned Female at [employer] Who Carry Truncating Variants  Annual mammogram to begin at age 8 and consider breast MRI with and without contrast starting at age 25-35 years Individuals Assigned Female at Birth Who Carry Missense Variants  General population breast cancer screening with increased screening tailored to the family history Individuals Assigned Female at Birth Who Carry Truncating Variants  Advise patients on breast self-examination and symptom awareness Prostate Cancer  Individuals Assigned Female at Birth Who Carry Truncating Variants  Discuss PSA prostate cancer screening through shared decision-making with a healthcare provider, particularly when there is family history of prostate cancer; increased-risk prostate cancer screening may  include annual PSA testing beginning at age 109 Colon Cancer  Manage based on family history. If no family history of colorectal cancer, general population screening is  recommended.   Per the NCCN, as long as there is no personal or family history of colon cancer or polyps, general population screening for colon cancer from ages 65-75 includes one of the following:  Colonoscopy every 10 years -OR- Stool- Guaiac-based testing or Fecal immunochemical test (FIT) every year -OR- Multi-targeted stool DNA (mt-sDNA)-based testing every 3 years -OR- Flexible sigmoidoscopy every 5 years -OR- CT colonography every 5 years Here is an excellent review for patients about different colon cancer screening options.  Conclusion  NCCN guidelines have recently updated their recommendations for patients who carry CHEK2 pathogenic variants, moving away from any increased screening for colon cancer. This is an important reminder that, as new information becomes available through research, our understanding of the risks associated with different genetic variants changes, and the recommendations for care also change.    If you carry a genetic variant associated with cancer risk, you are encouraged to check back with a genetic counselor every 3-5 years to learn if there is any updated information on your variant that may change your care.  References:  Nelle Ban, Jefm Minium, Lyndle Santa, and Lauran Pollard. "Checkpoint kinase 2 (CHEK2) gene and its mutations." Journal of Medical Genetics, vol. 35, no. 9, 1998, pp. 700-704.  Ragena Bunker, Eeles RA, Houlston RS, Bledsoe, Majel Scott [place], [place], [place] CG, Wyonia Hefty IP. Apparent Mendelian inheritance of breast and colorectal cancer: chance, genetic heterogeneity or a new gene? Fam Cancer. 2001;1(3-4):189-95. doi: 10.1023/a:1021101014264. PMID: 40981191.  Bychkovsky BL, Agaoglu NB, Horton C, Zhou J, Yussuf A, Hemyari  P, [place], Young C, LaDuca H, McGuinness DL, [place], [place] JE, Rana HQ. Differences in Cancer Phenotypes Among Frequent CHEK2 Variants and Implications for Clinical Care-Checking CHEK2. JAMA Oncol. 2022 Nov 1;8(11):1598-1606. doi: 10.1001/jamaoncol.2022.4071. PMID: 47829562; PMCID: [id number].

## 2024-06-15 ENCOUNTER — Other Ambulatory Visit (INDEPENDENT_AMBULATORY_CARE_PROVIDER_SITE_OTHER)

## 2024-06-15 ENCOUNTER — Ambulatory Visit (INDEPENDENT_AMBULATORY_CARE_PROVIDER_SITE_OTHER): Admitting: Physician Assistant

## 2024-06-15 ENCOUNTER — Encounter: Payer: Self-pay | Admitting: Physician Assistant

## 2024-06-15 VITALS — Ht 64.17 in | Wt 298.8 lb

## 2024-06-15 DIAGNOSIS — M1712 Unilateral primary osteoarthritis, left knee: Secondary | ICD-10-CM

## 2024-06-15 MED ORDER — METHYLPREDNISOLONE ACETATE 40 MG/ML IJ SUSP
40.0000 mg | INTRAMUSCULAR | Status: AC | PRN
  Administered 2024-06-15: 40 mg via INTRA_ARTICULAR

## 2024-06-15 MED ORDER — LIDOCAINE HCL 1 % IJ SOLN
3.0000 mL | INTRAMUSCULAR | Status: AC | PRN
  Administered 2024-06-15: 3 mL

## 2024-06-15 NOTE — Progress Notes (Addendum)
 Office Visit Note   Patient: Destiny Griffin           Date of Birth: 1971-01-13           MRN: [id number] Visit Date: 06/15/2024              Requested by: Rosamond Leta NOVAK, MD [address] [place],  [place] [address] PCP: Rosamond Leta NOVAK, MD   Assessment & Plan: Visit Diagnoses:  1. Primary osteoarthritis of left knee     Plan: Discussed knee friendly exercises with her.  Discussed quad strengthening exercises.  She can use Voltaren gel up to 4 g 4 times daily over the knee.  Discussed other conservative measures including weight loss injections.  She was agreeable with a cortisone injection today in the knee.  She will follow-up with us  on a as needed basis she knows to wait at least 3 months between cortisone injections.  She is given a prescription for physical therapy in [place] for left knee range of motion quad strengthening gait balance.  Follow-Up Instructions: Return if symptoms worsen or fail to improve.   Orders:  Orders Placed This Encounter  Procedures   Large Joint Inj: L knee   XR Knee 1-2 Views Left   No orders of the defined types were placed in this encounter.     Procedures: Large Joint Inj: L knee on 06/15/2024 9:53 AM Indications: pain Details: 22 G 1.5 in needle, anterolateral approach  Arthrogram: No  Medications: 3 mL lidocaine  1 %; 40 mg methylPREDNISolone  acetate 40 MG/ML Outcome: tolerated well, no immediate complications Procedure, treatment alternatives, risks and benefits explained, specific risks discussed. Consent was given by the patient. Immediately prior to procedure a time out was called to verify the correct patient, procedure, equipment, support staff and site/side marked as required. Patient was prepped and draped in the usual sterile fashion.       Clinical Data: No additional findings.   Subjective: Chief Complaint  Patient presents with   Left Knee - Pain    HPI Ms. Chacko is a 53 year old female comes in today for left knee  pain.  She has had chronic knee pain for over 10 years.  History of a knee arthroscopy some 10 years ago.  She reports she recently went to [place] in August and did a lot of walking since she returned she had increased pain in the left knee.  No new injury.  Pain is 3-4 out of 10 out of pain at worst over the last week.  Most pain is medial aspect.  She takes ibuprofen which helps some.  She is using Voltaren gel on the knee 2 times a day.  She is trying to work on weight loss. Review of Systems  Constitutional:  Negative for chills and fever.     Objective: Vital Signs: Ht 5' 4.17 (1.63 m)   Wt 298 lb 12.8 oz (135.5 kg)   BMI 51.01 kg/m   Physical Exam Constitutional:      Appearance: She is not ill-appearing or diaphoretic.  Pulmonary:     Effort: Pulmonary effort is normal.  Neurological:     Mental Status: She is alert and oriented to person, place, and time.  Psychiatric:        Mood and Affect: Mood normal.     Ortho Exam Bilateral knees: Good range of motion both knees.  Slight crepitus left knee with active range of motion.  No gross instability of either knee no abnormal  warmth erythema or effusion of either knee.  Left knee tenderness along the medial and lateral joint line.  Right knee tenderness along medial joint line. Ambulates with a cane in her right hand.  Slight antalgic gait.  Specialty Comments:  No specialty comments available.  Imaging: XR Knee 1-2 Views Left Result Date: 06/15/2024 Left knee 2 views: Shows bone-on-bone medial compartment.  There is healed age indeterminant prior medial tibial plateau fracture.  Overall slight depression.  Moderate lateral compartmental arthritis with periarticular spurs.  Severe patellofemoral arthritis with periarticular spurring.  No acute fractures.  No evidence osteopenia.  No bony abnormalities otherwise.    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)    Personal history of radiation therapy    Sleep apnea    hx of sleep apnea around 2000, ENT removed uvula, no sleep apnea since    Family History  Problem Relation Age of Onset   Breast cancer Mother 26       Bilateral   Stomach cancer Father     Past Surgical History:  Procedure Laterality Date   BACK SURGERY  2008   lumbar fusion   BREAST EXCISIONAL BIOPSY Left 2005   removed lump   BREAST LUMPECTOMY Left 10/09/2021   BREAST LUMPECTOMY WITH RADIOACTIVE SEED AND SENTINEL LYMPH NODE BIOPSY Left 10/09/2021   Procedure: LEFT BREAST LUMPECTOMY WITH RADIOACTIVE SEED AND SENTINEL LYMPH NODE BIOPSY;  Surgeon: Aron Shoulders, MD;  Location: [facility] OR;  Service: General;  Laterality: Left;   DILATION AND CURETTAGE OF UTERUS     1987 and 1988   TONSILLECTOMY     removed as a child   Social History   Occupational History   Not on file  Tobacco Use   Smoking status: Never   Smokeless tobacco: Never  Vaping Use   Vaping status: Never Used  Substance and Sexual Activity   Alcohol use: Yes    Comment: very rare (glass of wine maybe just on holidays)   Drug use: Never   Sexual activity: Yes    Birth control/protection: Surgical    Comment: husband- vasectomy

## 2024-06-20 ENCOUNTER — Other Ambulatory Visit: Payer: Self-pay | Admitting: General Surgery

## 2024-06-20 DIAGNOSIS — Z853 Personal history of malignant neoplasm of breast: Secondary | ICD-10-CM

## 2024-07-25 ENCOUNTER — Encounter: Payer: Self-pay | Admitting: Radiology

## 2024-07-27 ENCOUNTER — Other Ambulatory Visit: Payer: Self-pay | Admitting: Medical Genetics

## 2024-08-03 ENCOUNTER — Ambulatory Visit
Admission: RE | Admit: 2024-08-03 | Discharge: 2024-08-03 | Disposition: A | Discharge status: Home / Self Care | Source: Ambulatory Visit | Attending: General Surgery | Admitting: General Surgery

## 2024-08-03 DIAGNOSIS — Z853 Personal history of malignant neoplasm of breast: Secondary | ICD-10-CM
